# Patient Record
Sex: Female | Born: 1935 | Race: Black or African American | Hispanic: No | Smoking: Never smoker
Health system: Southern US, Community
[De-identification: ages and names within clinical notes are randomized; demographics above are authoritative.]

## PROBLEM LIST (undated history)

## (undated) DIAGNOSIS — E785 Hyperlipidemia, unspecified: Secondary | ICD-10-CM

## (undated) DIAGNOSIS — H9190 Unspecified hearing loss, unspecified ear: Secondary | ICD-10-CM

## (undated) DIAGNOSIS — E0590 Thyrotoxicosis, unspecified without thyrotoxic crisis or storm: Secondary | ICD-10-CM

## (undated) DIAGNOSIS — C50919 Malignant neoplasm of unspecified site of unspecified female breast: Secondary | ICD-10-CM

## (undated) DIAGNOSIS — I10 Essential (primary) hypertension: Secondary | ICD-10-CM

## (undated) HISTORY — DX: Essential (primary) hypertension: I10

## (undated) HISTORY — DX: Hyperlipidemia, unspecified: E78.5

## (undated) HISTORY — DX: Unspecified hearing loss, unspecified ear: H91.90

## (undated) HISTORY — DX: Thyrotoxicosis, unspecified without thyrotoxic crisis or storm: E05.90

---

## 2004-01-09 DIAGNOSIS — C50919 Malignant neoplasm of unspecified site of unspecified female breast: Secondary | ICD-10-CM

## 2004-01-09 HISTORY — DX: Malignant neoplasm of unspecified site of unspecified female breast: C50.919

## 2004-12-20 ENCOUNTER — Ambulatory Visit: Payer: Self-pay | Admitting: Internal Medicine

## 2005-01-08 HISTORY — PX: BREAST EXCISIONAL BIOPSY: SUR124

## 2005-01-23 ENCOUNTER — Ambulatory Visit: Payer: Self-pay | Admitting: Surgery

## 2005-01-23 ENCOUNTER — Other Ambulatory Visit: Payer: Self-pay

## 2005-02-01 ENCOUNTER — Ambulatory Visit: Payer: Self-pay | Admitting: Surgery

## 2005-03-02 ENCOUNTER — Ambulatory Visit: Payer: Self-pay | Admitting: Internal Medicine

## 2005-06-05 ENCOUNTER — Ambulatory Visit: Payer: Self-pay | Admitting: Internal Medicine

## 2005-06-08 ENCOUNTER — Ambulatory Visit: Payer: Self-pay | Admitting: Internal Medicine

## 2005-09-04 ENCOUNTER — Ambulatory Visit: Payer: Self-pay | Admitting: Internal Medicine

## 2005-09-08 ENCOUNTER — Ambulatory Visit: Payer: Self-pay | Admitting: Internal Medicine

## 2006-01-10 ENCOUNTER — Ambulatory Visit: Payer: Self-pay | Admitting: Internal Medicine

## 2006-02-08 ENCOUNTER — Ambulatory Visit: Payer: Self-pay | Admitting: Internal Medicine

## 2006-05-09 ENCOUNTER — Ambulatory Visit: Payer: Self-pay | Admitting: Internal Medicine

## 2006-05-13 ENCOUNTER — Ambulatory Visit: Payer: Self-pay | Admitting: Internal Medicine

## 2006-06-09 ENCOUNTER — Ambulatory Visit: Payer: Self-pay | Admitting: Internal Medicine

## 2006-09-09 ENCOUNTER — Ambulatory Visit: Payer: Self-pay | Admitting: Internal Medicine

## 2006-09-23 ENCOUNTER — Ambulatory Visit: Payer: Self-pay | Admitting: Internal Medicine

## 2006-10-09 ENCOUNTER — Ambulatory Visit: Payer: Self-pay | Admitting: Internal Medicine

## 2007-02-09 ENCOUNTER — Ambulatory Visit: Payer: Self-pay | Admitting: Internal Medicine

## 2007-02-17 ENCOUNTER — Ambulatory Visit: Payer: Self-pay | Admitting: Internal Medicine

## 2007-03-09 ENCOUNTER — Ambulatory Visit: Payer: Self-pay | Admitting: Internal Medicine

## 2007-04-09 ENCOUNTER — Ambulatory Visit: Payer: Self-pay | Admitting: Internal Medicine

## 2007-08-09 ENCOUNTER — Ambulatory Visit: Payer: Self-pay | Admitting: Internal Medicine

## 2007-08-19 ENCOUNTER — Ambulatory Visit: Payer: Self-pay | Admitting: Internal Medicine

## 2007-09-09 ENCOUNTER — Ambulatory Visit: Payer: Self-pay | Admitting: Internal Medicine

## 2007-11-09 ENCOUNTER — Ambulatory Visit: Payer: Self-pay | Admitting: Internal Medicine

## 2009-09-06 ENCOUNTER — Ambulatory Visit: Payer: Self-pay | Admitting: Oncology

## 2009-09-08 ENCOUNTER — Ambulatory Visit: Payer: Self-pay | Admitting: Internal Medicine

## 2009-09-09 ENCOUNTER — Ambulatory Visit: Payer: Self-pay | Admitting: Internal Medicine

## 2009-10-08 ENCOUNTER — Ambulatory Visit: Payer: Self-pay | Admitting: Internal Medicine

## 2010-08-14 ENCOUNTER — Emergency Department: Payer: Self-pay | Admitting: Emergency Medicine

## 2010-10-02 ENCOUNTER — Ambulatory Visit: Payer: Self-pay | Admitting: Internal Medicine

## 2010-10-09 ENCOUNTER — Ambulatory Visit: Payer: Self-pay | Admitting: Internal Medicine

## 2010-11-08 ENCOUNTER — Ambulatory Visit: Payer: Self-pay | Admitting: Internal Medicine

## 2010-11-09 ENCOUNTER — Ambulatory Visit: Payer: Self-pay | Admitting: Internal Medicine

## 2011-12-17 IMAGING — US ULTRASOUND AORTA
1 series · 17 of 21 positions shown · non-contrast
Comparison: none

REASON FOR EXAM: abd bruit
COMMENTS:

[Series 1: ultrasound aorta · 17 of 21 slices shown]
[im 1/21]
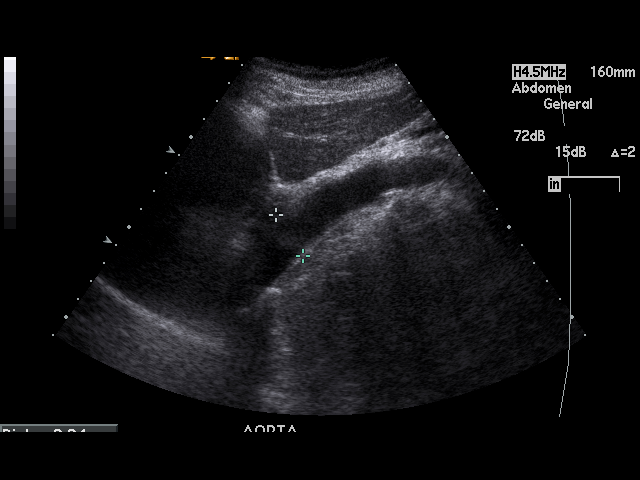
[im 2/21]
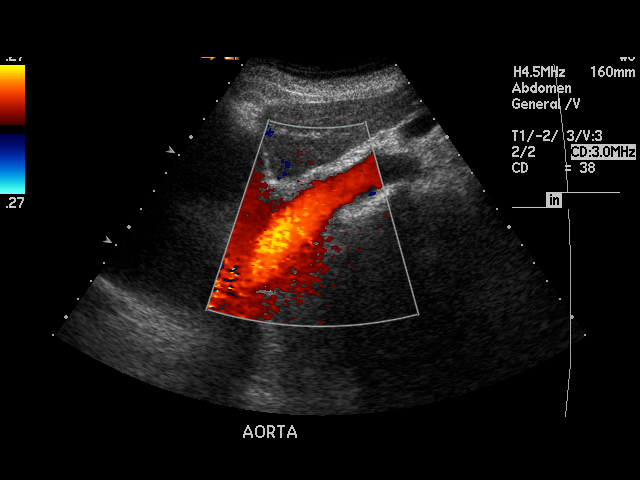
[im 4/21]
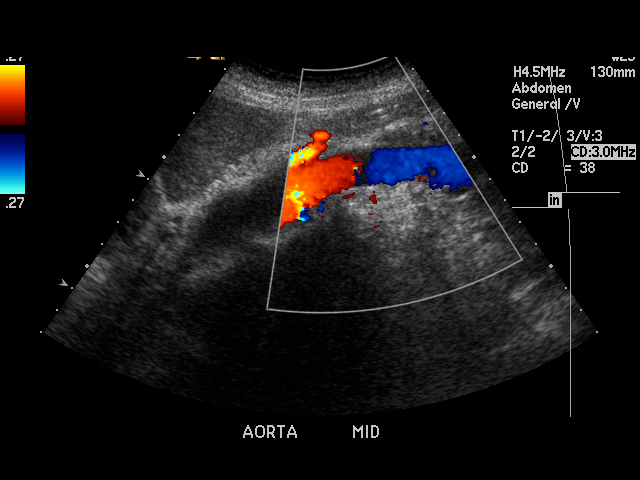
[im 5/21]
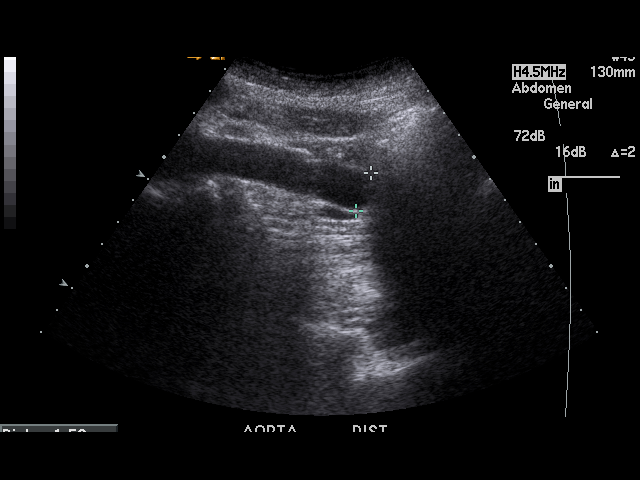
[im 6/21]
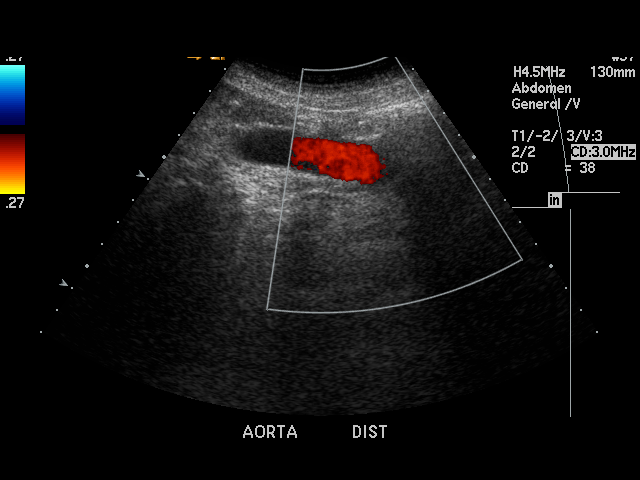
[im 7/21]
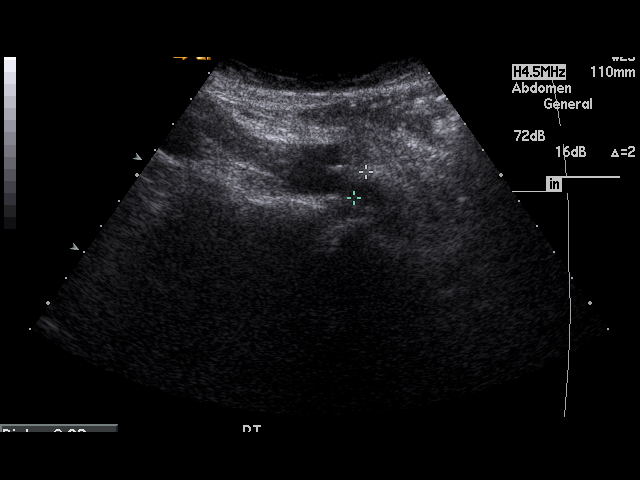
[im 9/21]
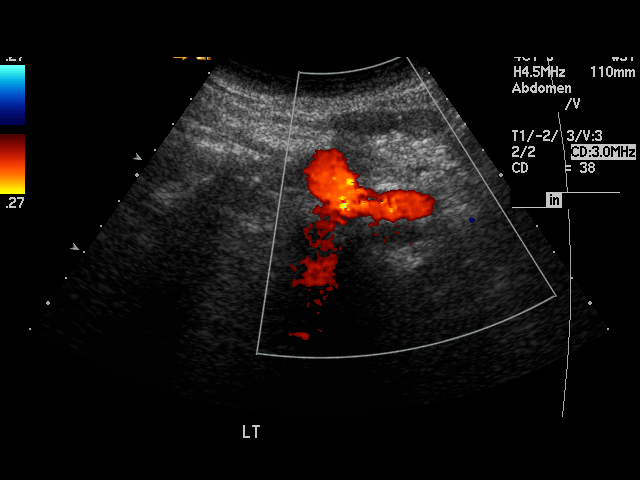
[im 10/21]
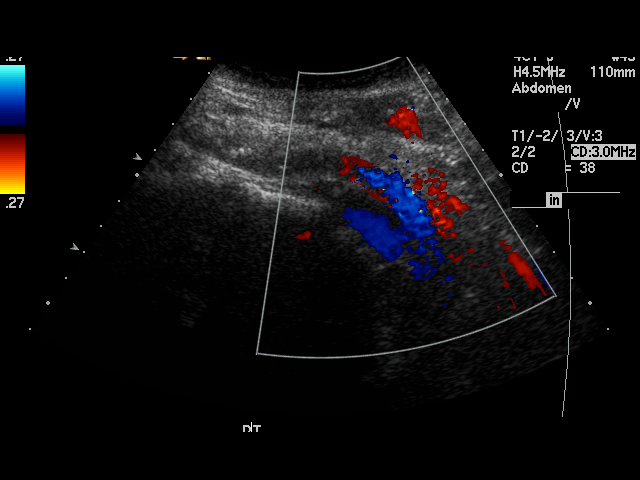
[im 11/21]
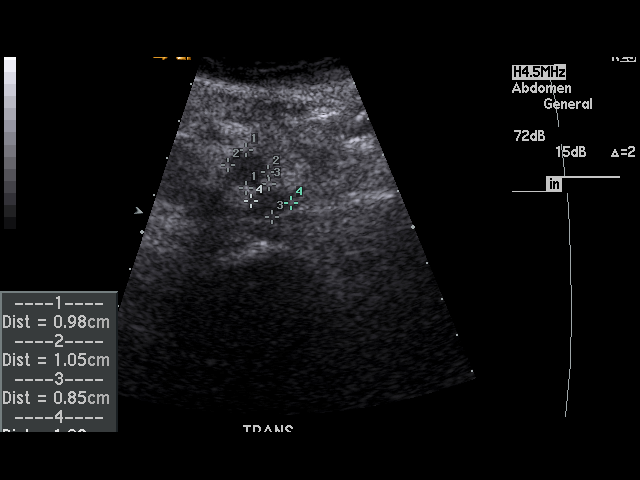
[im 12/21]
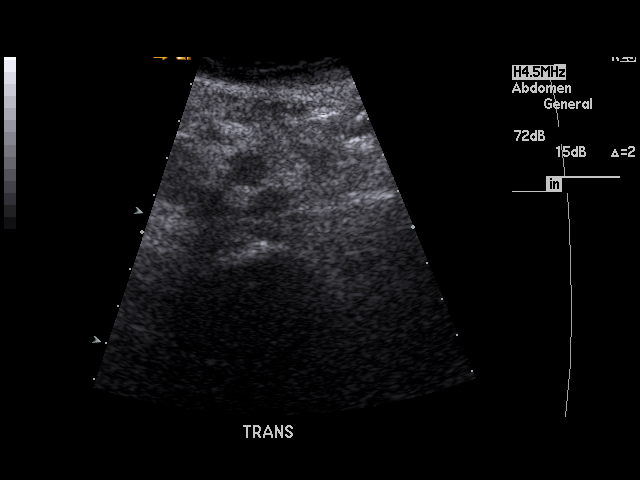
[im 13/21]
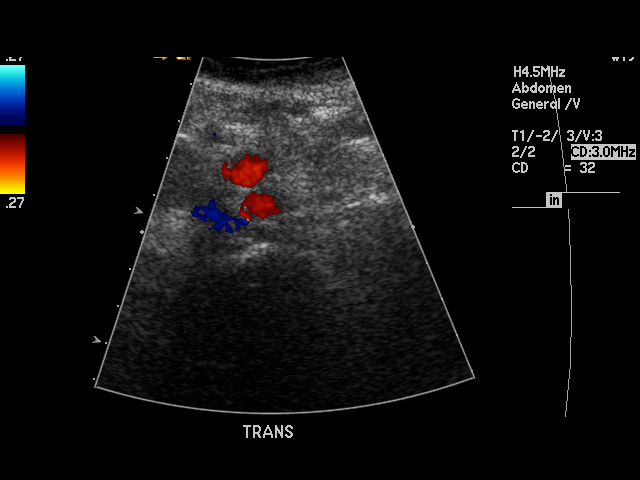
[im 15/21]
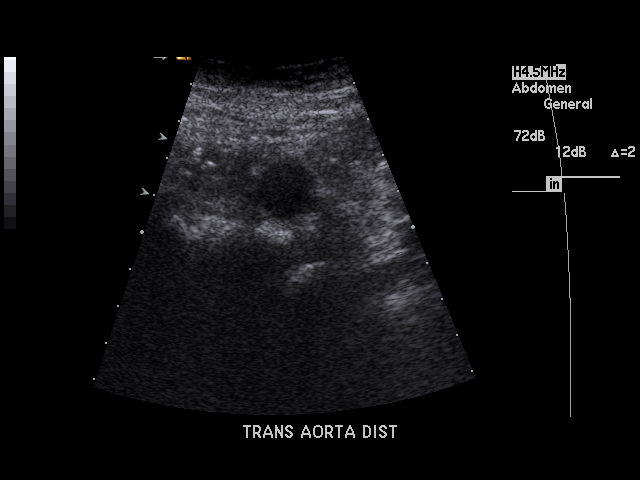
[im 16/21]
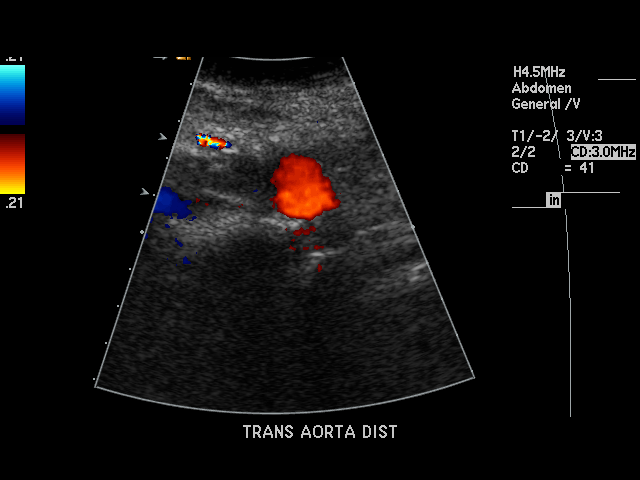
[im 17/21]
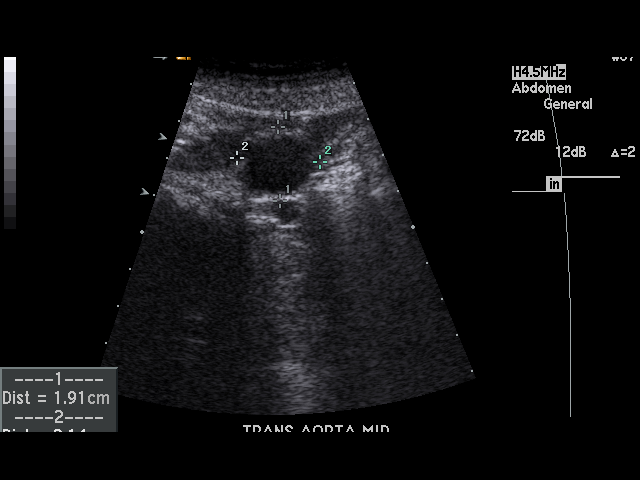
[im 18/21]
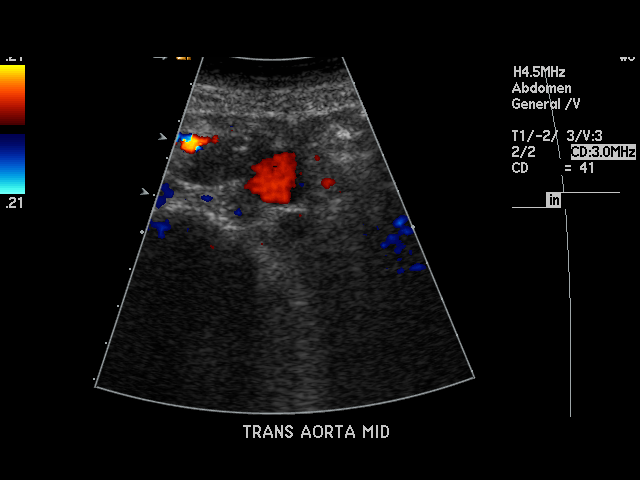
[im 20/21]
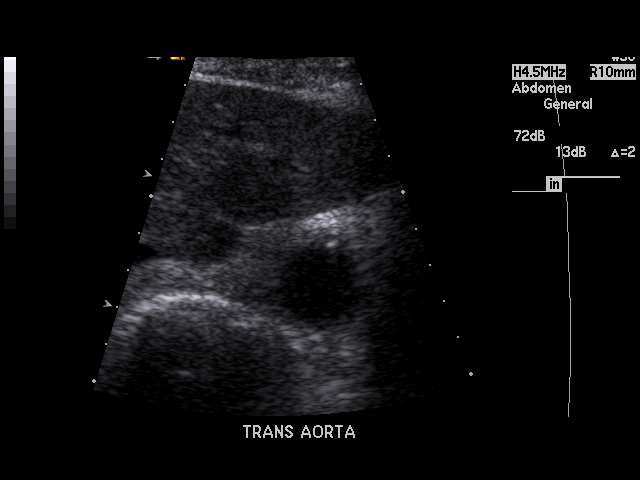
[im 21/21]
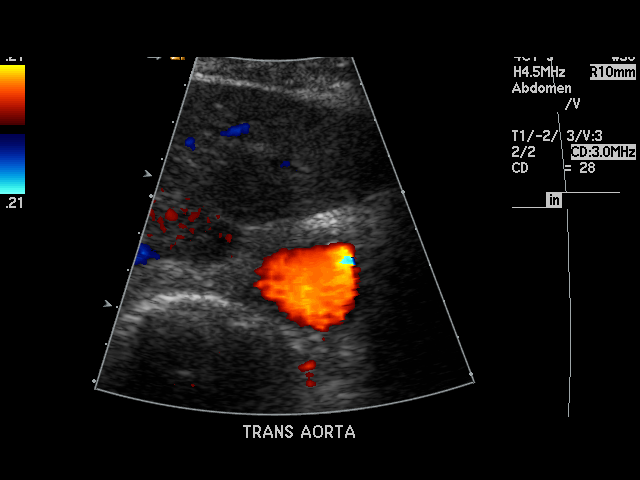

[17 of 21 positions shown; findings below may reference images not displayed]

PROCEDURE:     ABDUL-SAMED - ABDUL-SAMED AORTA  - November 08, 2010  [DATE]

RESULT:     The abdominal aorta is visualized and is normal in appearance.
No aneurysm formation or stenosis is identified. The aorta measures 2.48 cm
at maximum diameter as measured in its proximal portion. The common iliac
arteries are normal in appearance.
IMPRESSION: 1.     No significant abnormalities are noted.

## 2012-01-25 ENCOUNTER — Other Ambulatory Visit: Payer: Self-pay | Admitting: Internal Medicine

## 2012-01-25 MED ORDER — RAMIPRIL 2.5 MG PO CAPS: 2.5000 mg | ORAL_CAPSULE | Freq: Every day | ORAL | Status: DC

## 2012-01-25 MED ORDER — POTASSIUM CHLORIDE ER 10 MEQ PO CPCR: 10.0000 meq | ORAL_CAPSULE | Freq: Two times a day (BID) | ORAL | Status: DC

## 2012-01-25 MED ORDER — TRIAMTERENE-HCTZ 37.5-25 MG PO TABS: 1.0000 | ORAL_TABLET | Freq: Every day | ORAL | Status: DC

## 2012-01-25 NOTE — Telephone Encounter (Signed)
Potassium Chloride 10 meq   Take two tablets by mouth every morning  #60  Ramipril 2.5 mg   Take 1 capsule by mouth once daily  # 30  Triamterene HCTZ 37.5-25 mg   Take 1 tablet by mouth every day  # 30

## 2012-01-25 NOTE — Telephone Encounter (Signed)
Sent in to pharmacy.  

## 2012-03-24 ENCOUNTER — Encounter: Payer: Self-pay | Admitting: *Deleted

## 2012-03-27 ENCOUNTER — Encounter: Payer: Self-pay | Admitting: Internal Medicine

## 2012-03-27 ENCOUNTER — Ambulatory Visit (INDEPENDENT_AMBULATORY_CARE_PROVIDER_SITE_OTHER): Payer: 59 | Admitting: Internal Medicine

## 2012-03-27 VITALS — BP 160/90 | HR 91 | Temp 98.5°F | Ht 59.0 in | Wt 114.0 lb

## 2012-03-27 DIAGNOSIS — N6310 Unspecified lump in the right breast, unspecified quadrant: Secondary | ICD-10-CM | POA: Insufficient documentation

## 2012-03-27 DIAGNOSIS — D0592 Unspecified type of carcinoma in situ of left breast: Secondary | ICD-10-CM

## 2012-03-27 DIAGNOSIS — D0590 Unspecified type of carcinoma in situ of unspecified breast: Secondary | ICD-10-CM

## 2012-03-27 DIAGNOSIS — I10 Essential (primary) hypertension: Secondary | ICD-10-CM | POA: Insufficient documentation

## 2012-03-27 DIAGNOSIS — E0590 Thyrotoxicosis, unspecified without thyrotoxic crisis or storm: Secondary | ICD-10-CM

## 2012-03-27 LAB — CBC WITH DIFFERENTIAL/PLATELET
Eosinophils Relative: 3 % (ref 0.0–5.0)
HCT: 39.3 % (ref 36.0–46.0)
Hemoglobin: 13 g/dL (ref 12.0–15.0)
Lymphs Abs: 1 10*3/uL (ref 0.7–4.0)
Monocytes Relative: 14.6 % — ABNORMAL HIGH (ref 3.0–12.0)
Neutro Abs: 1.3 10*3/uL — ABNORMAL LOW (ref 1.4–7.7)
WBC: 2.8 10*3/uL — ABNORMAL LOW (ref 4.5–10.5)

## 2012-03-27 LAB — COMPREHENSIVE METABOLIC PANEL
AST: 19 U/L (ref 0–37)
Alkaline Phosphatase: 49 U/L (ref 39–117)
BUN: 25 mg/dL — ABNORMAL HIGH (ref 6–23)
Creatinine, Ser: 1.1 mg/dL (ref 0.4–1.2)
Potassium: 4 mEq/L (ref 3.5–5.1)

## 2012-03-27 LAB — LIPID PANEL
Cholesterol: 148 mg/dL (ref 0–200)
HDL: 58.3 mg/dL (ref >39.00)
LDL Cholesterol: 78 mg/dL (ref 0–99)
Triglycerides: 59 mg/dL (ref 0.0–149.0)
VLDL: 11.8 mg/dL (ref 0.0–40.0)

## 2012-03-27 MED ORDER — TRIAMTERENE-HCTZ 37.5-25 MG PO TABS: 1.0000 | ORAL_TABLET | Freq: Every day | ORAL | Status: DC

## 2012-03-27 MED ORDER — RAMIPRIL 2.5 MG PO CAPS: 2.5000 mg | ORAL_CAPSULE | Freq: Every day | ORAL | Status: DC

## 2012-03-27 MED ORDER — POTASSIUM CHLORIDE ER 10 MEQ PO CPCR: 10.0000 meq | ORAL_CAPSULE | Freq: Two times a day (BID) | ORAL | Status: DC

## 2012-03-27 NOTE — Assessment & Plan Note (Signed)
S/p ablation.  Check tsh.    

## 2012-03-27 NOTE — Progress Notes (Signed)
  Subjective:    Patient ID: Ellen Wright, female    DOB: 04/13/1918, 79 y.o.   MRN: [id number]  HPI 79 year old female with past history of hypertension, hearing loss and hyperthyroidism s/p ablation who presents to clinic to follow up on these issues as well as for a complete physical exam.  She is accompanied by her daughter.  History obtained from both of them.  Her daughter states she has been doing well.  Breathing stable.  Eating well.  No nausea or vomiting.  Still problems with constipation.  Discussed miralax.     Past Medical History  Diagnosis Date  . Hypertension   . Hyperlipidemia   . Hyperthyroidism     s/p ablation  . Hearing loss     Current Outpatient Prescriptions on File Prior to Visit  Medication Sig Dispense Refill  . dorzolamide-timolol (COSOPT) 22.3-6.8 MG/ML ophthalmic solution 1 drop 2 (two) times daily.      . Travoprost, BAK Free, (TRAVATAN) 0.004 % SOLN ophthalmic solution 1 drop at bedtime.      . Fish Oil-Cholecalciferol (OMEGA-3 FISH OIL/VITAMIN D3) 1000-1000 MG-UNIT CAPS Take by mouth.      . letrozole (FEMARA) 2.5 MG tablet Take 2.5 mg by mouth daily.       No current facility-administered medications on file prior to visit.    Review of Systems Patient denies any headache, lightheadedness or dizziness. No sinus or allergy symptoms.  Persistent hearing loss.  No chest pain, tightness or palpitations.  No increased shortness of breath, cough or congestion.  No nausea or vomiting.  No acid reflux.  No abdominal pain or cramping.  No bowel change, such as diarrhea, BRBPR or melana.  Does have persistent problems with constipation.  No urine change.   States eating and drinking well.  Off Femara.      Objective:   Physical Exam Filed Vitals:   03/27/12 0806  BP: 160/90  Pulse: 91  Temp: 98.5 F (25.69 C)   79 year old female in no acute distress.   HEENT:  Nares- clear.  Oropharynx - without lesions. NECK:  Supple.  Nontender.  No audible bruit.   HEART:  Appears to be regular. LUNGS:  No crackles or wheezing audible.  Respirations even and unlabored.  RADIAL PULSE:  Equal bilaterally.    BREASTS:  No nipple discharge or nipple retraction present.  Could not appreciate any distinct nodules or axillary adenopathy.  ABDOMEN:  Soft, nontender.  Bowel sounds present and normal.  No audible abdominal bruit.  GU:  Pt declined.   RECTAL:  Pt declined.   EXTREMITIES:  No increased edema present.  DP pulses palpable and equal bilaterally.          Assessment & Plan:  CARDIOVASCULAR.  Symptoms stable.  No acute issues.  Has declined further w/up.  PREVIOUS WEIGHT LOSS.  Weight stable.  Follow.   HISTORY OF ABDOMINAL BRUIT.  Aortic ultrasound revealed no significant abnormalities.   HEALTH MAINTENANCE.  Physical today.  Schedule mammogram.  Declined GU and rectal exam. Check cholesterol.  Declines GI evaluation.

## 2012-03-27 NOTE — Assessment & Plan Note (Signed)
Blood pressure a little elevated today.  Recheck 154/78.  Same medication regimen.  Check metabolic panel.

## 2012-03-27 NOTE — Assessment & Plan Note (Signed)
Off Femara.  States has been released by the [employer].  Schedule mammogram.

## 2012-04-05 ENCOUNTER — Other Ambulatory Visit: Payer: Self-pay | Admitting: Internal Medicine

## 2012-04-05 DIAGNOSIS — N289 Disorder of kidney and ureter, unspecified: Secondary | ICD-10-CM

## 2012-04-05 DIAGNOSIS — D72819 Decreased white blood cell count, unspecified: Secondary | ICD-10-CM

## 2012-04-05 NOTE — Progress Notes (Signed)
Order placed for follow up labs 

## 2012-05-08 ENCOUNTER — Ambulatory Visit: Payer: 59 | Admitting: Internal Medicine

## 2012-10-09 ENCOUNTER — Other Ambulatory Visit: Payer: Self-pay | Admitting: *Deleted

## 2012-10-09 NOTE — Telephone Encounter (Signed)
i am ok to refill these meds, but she needs a follow up appt.  (needs to be ).  Ok to refill until appt.  Thanks.

## 2012-10-09 NOTE — Telephone Encounter (Signed)
Only seen once in March (New patient)-Okay to refill?

## 2012-10-09 NOTE — Telephone Encounter (Signed)
duplicate

## 2012-10-10 MED ORDER — RAMIPRIL 2.5 MG PO CAPS: 2.5000 mg | ORAL_CAPSULE | Freq: Every day | ORAL | Status: DC

## 2012-10-10 MED ORDER — POTASSIUM CHLORIDE ER 10 MEQ PO CPCR: 10.0000 meq | ORAL_CAPSULE | Freq: Two times a day (BID) | ORAL | Status: DC

## 2012-10-10 MED ORDER — TRIAMTERENE-HCTZ 37.5-25 MG PO TABS: 1.0000 | ORAL_TABLET | Freq: Every day | ORAL | Status: DC

## 2012-10-10 NOTE — Telephone Encounter (Signed)
Refilled meds #30-pt scheduled for a 30 min appt on 10/9 @ 10:30

## 2012-10-16 ENCOUNTER — Encounter (INDEPENDENT_AMBULATORY_CARE_PROVIDER_SITE_OTHER): Payer: Self-pay

## 2012-10-16 ENCOUNTER — Ambulatory Visit (INDEPENDENT_AMBULATORY_CARE_PROVIDER_SITE_OTHER): Payer: PRIVATE HEALTH INSURANCE | Admitting: Internal Medicine

## 2012-10-16 ENCOUNTER — Encounter: Payer: Self-pay | Admitting: Internal Medicine

## 2012-10-16 VITALS — BP 130/80 | HR 85 | Temp 98.3°F | Ht 59.0 in | Wt 117.0 lb

## 2012-10-16 DIAGNOSIS — C50919 Malignant neoplasm of unspecified site of unspecified female breast: Secondary | ICD-10-CM

## 2012-10-16 DIAGNOSIS — I10 Essential (primary) hypertension: Secondary | ICD-10-CM

## 2012-10-16 DIAGNOSIS — Z23 Encounter for immunization: Secondary | ICD-10-CM

## 2012-10-16 DIAGNOSIS — E0590 Thyrotoxicosis, unspecified without thyrotoxic crisis or storm: Secondary | ICD-10-CM

## 2012-10-16 LAB — COMPREHENSIVE METABOLIC PANEL
ALT: 9 U/L (ref 0–35)
AST: 19 U/L (ref 0–37)
Albumin: 3.9 g/dL (ref 3.5–5.2)
BUN: 20 mg/dL (ref 6–23)
CO2: 31 mEq/L (ref 19–32)
Calcium: 9.8 mg/dL (ref 8.4–10.5)
Chloride: 102 mEq/L (ref 96–112)
Creatinine, Ser: 1.2 mg/dL (ref 0.4–1.2)
GFR: 53.03 mL/min — ABNORMAL LOW (ref >60.00)
Potassium: 3.9 mEq/L (ref 3.5–5.1)

## 2012-10-16 MED ORDER — TRIAMTERENE-HCTZ 37.5-25 MG PO TABS: 1.0000 | ORAL_TABLET | Freq: Every day | ORAL | Status: DC

## 2012-10-16 MED ORDER — RAMIPRIL 2.5 MG PO CAPS: 2.5000 mg | ORAL_CAPSULE | Freq: Every day | ORAL | Status: DC

## 2012-10-16 MED ORDER — POTASSIUM CHLORIDE ER 10 MEQ PO CPCR: 10.0000 meq | ORAL_CAPSULE | Freq: Two times a day (BID) | ORAL | Status: DC

## 2012-10-19 ENCOUNTER — Encounter: Payer: Self-pay | Admitting: Internal Medicine

## 2012-10-19 NOTE — Assessment & Plan Note (Signed)
Off Femara.  States has been released by the [employer]. Needs her mammogram.

## 2012-10-19 NOTE — Progress Notes (Signed)
  Subjective:    Patient ID: Ellen Wright, female    DOB: 03-11-1918, 79 y.o.   MRN: [id number]  HPI 79 year old female with past history of hypertension, hearing loss and hyperthyroidism s/p ablation who presents to clinic for a scheduled follow up.  She is accompanied by her daughter.  History obtained from both of them.  Her daughter states she has been doing well.  Breathing stable.  Eating well.  No nausea or vomiting.  Still problems with some constipation.  Not severe.  Discussed miralax. Overall doing well.      Past Medical History  Diagnosis Date  . Hypertension   . Hyperlipidemia   . Hyperthyroidism     s/p ablation  . Hearing loss     Current Outpatient Prescriptions on File Prior to Visit  Medication Sig Dispense Refill  . Fish Oil-Cholecalciferol (OMEGA-3 FISH OIL/VITAMIN D3) 1000-1000 MG-UNIT CAPS Take by mouth.      . letrozole (FEMARA) 2.5 MG tablet Take 2.5 mg by mouth daily.      . Travoprost, BAK Free, (TRAVATAN) 0.004 % SOLN ophthalmic solution 1 drop at bedtime.       No current facility-administered medications on file prior to visit.    Review of Systems Patient denies any headache, lightheadedness or dizziness. No sinus or allergy symptoms.  Persistent hearing loss.  No chest pain, tightness or palpitations.  No increased shortness of breath, cough or congestion.  No nausea or vomiting.  No acid reflux.  No abdominal pain or cramping.  No bowel change, such as diarrhea, BRBPR or melana.  Does have persistent problems with constipation.  No urine change.   States eating and drinking well.  Off Femara.      Objective:   Physical Exam  Filed Vitals:   10/16/12 1034  BP: 130/80  Pulse: 85  Temp: 98.3 F (2.100 C)   79 year old female in no acute distress.   HEENT:  Nares- clear.  Oropharynx - without lesions. NECK:  Supple.  Nontender.  No audible bruit.  HEART:  Appears to be regular. LUNGS:  No crackles or wheezing audible.  Respirations even and  unlabored.  RADIAL PULSE:  Equal bilaterally.  ABDOMEN:  Soft, nontender.  Bowel sounds present and normal.  No audible abdominal bruit.   EXTREMITIES:  No increased edema present.  DP pulses palpable and equal bilaterally.          Assessment & Plan:  CARDIOVASCULAR.  Symptoms stable.  No acute issues.  Has declined further w/up.  PREVIOUS WEIGHT LOSS.  Weight stable.  Up a few pounds.  Follow.   HISTORY OF ABDOMINAL BRUIT.  Aortic ultrasound revealed no significant abnormalities.   HEALTH MAINTENANCE.  Physical last visit.  Scheduled mammogram previous visit.  Declined GU and rectal exam. Check cholesterol.  Declines GI evaluation.

## 2012-10-19 NOTE — Assessment & Plan Note (Signed)
Blood pressure under reasonable control.  Same medication regimen.  Check metabolic panel.

## 2012-10-19 NOTE — Assessment & Plan Note (Signed)
S/p ablation.  Check tsh.    

## 2013-03-30 ENCOUNTER — Encounter: Payer: 59 | Admitting: Internal Medicine

## 2013-05-18 ENCOUNTER — Other Ambulatory Visit: Payer: Self-pay | Admitting: *Deleted

## 2013-05-18 MED ORDER — POTASSIUM CHLORIDE ER 10 MEQ PO CPCR: 10.0000 meq | ORAL_CAPSULE | Freq: Two times a day (BID) | ORAL | Status: DC

## 2013-05-21 ENCOUNTER — Telehealth: Payer: Self-pay | Admitting: Internal Medicine

## 2013-05-21 MED ORDER — TRIAMTERENE-HCTZ 37.5-25 MG PO TABS: 1.0000 | ORAL_TABLET | Freq: Every day | ORAL | Status: DC

## 2013-05-21 NOTE — Telephone Encounter (Signed)
triamterene-hydrochlorothiazide (MAXZIDE-25) 37.5-25 MG per

## 2013-06-17 ENCOUNTER — Other Ambulatory Visit: Payer: Self-pay | Admitting: *Deleted

## 2013-06-17 MED ORDER — RAMIPRIL 2.5 MG PO CAPS: 2.5000 mg | ORAL_CAPSULE | Freq: Every day | ORAL | Status: DC

## 2013-06-17 NOTE — Telephone Encounter (Signed)
Appt 8/28

## 2013-07-31 ENCOUNTER — Other Ambulatory Visit: Payer: Self-pay | Admitting: *Deleted

## 2013-07-31 MED ORDER — TRIAMTERENE-HCTZ 37.5-25 MG PO TABS: 1.0000 | ORAL_TABLET | Freq: Every day | ORAL | Status: DC

## 2013-07-31 MED ORDER — RAMIPRIL 2.5 MG PO CAPS: 2.5000 mg | ORAL_CAPSULE | Freq: Every day | ORAL | Status: DC

## 2013-07-31 MED ORDER — POTASSIUM CHLORIDE ER 10 MEQ PO CPCR: 10.0000 meq | ORAL_CAPSULE | Freq: Two times a day (BID) | ORAL | Status: DC

## 2013-08-04 ENCOUNTER — Encounter (INDEPENDENT_AMBULATORY_CARE_PROVIDER_SITE_OTHER): Payer: Self-pay

## 2013-08-04 ENCOUNTER — Encounter: Payer: Self-pay | Admitting: Internal Medicine

## 2013-08-04 ENCOUNTER — Ambulatory Visit (INDEPENDENT_AMBULATORY_CARE_PROVIDER_SITE_OTHER): Payer: PRIVATE HEALTH INSURANCE | Admitting: Internal Medicine

## 2013-08-04 VITALS — BP 126/80 | HR 95 | Temp 98.0°F | Ht 59.0 in | Wt 118.2 lb

## 2013-08-04 DIAGNOSIS — E0590 Thyrotoxicosis, unspecified without thyrotoxic crisis or storm: Secondary | ICD-10-CM

## 2013-08-04 DIAGNOSIS — C50919 Malignant neoplasm of unspecified site of unspecified female breast: Secondary | ICD-10-CM

## 2013-08-04 DIAGNOSIS — I10 Essential (primary) hypertension: Secondary | ICD-10-CM

## 2013-08-04 NOTE — Progress Notes (Signed)
Pre visit review using our clinic review tool, if applicable. No additional management support is needed unless otherwise documented below in the visit note. 

## 2013-08-05 LAB — CBC WITH DIFFERENTIAL/PLATELET
BASOS PCT: 0.6 % (ref 0.0–3.0)
Basophils Absolute: 0 10*3/uL (ref 0.0–0.1)
EOS PCT: 1.8 % (ref 0.0–5.0)
Eosinophils Absolute: 0.1 10*3/uL (ref 0.0–0.7)
HEMATOCRIT: 39.3 % (ref 36.0–46.0)
Hemoglobin: 13.1 g/dL (ref 12.0–15.0)
LYMPHS ABS: 0.9 10*3/uL (ref 0.7–4.0)
Lymphocytes Relative: 28.6 % (ref 12.0–46.0)
MCHC: 33.2 g/dL (ref 30.0–36.0)
MCV: 89.1 fl (ref 78.0–100.0)
MONO ABS: 0.5 10*3/uL (ref 0.1–1.0)
Monocytes Relative: 14 % — ABNORMAL HIGH (ref 3.0–12.0)
NEUTROS ABS: 1.8 10*3/uL (ref 1.4–7.7)
Neutrophils Relative %: 55 % (ref 43.0–77.0)
Platelets: 196 10*3/uL (ref 150.0–400.0)
RBC: 4.41 Mil/uL (ref 3.87–5.11)
RDW: 14.1 % (ref 11.5–15.5)
WBC: 3.3 10*3/uL — AB (ref 4.0–10.5)

## 2013-08-05 LAB — COMPREHENSIVE METABOLIC PANEL
ALBUMIN: 3.7 g/dL (ref 3.5–5.2)
ALT: 10 U/L (ref 0–35)
AST: 19 U/L (ref 0–37)
Alkaline Phosphatase: 56 U/L (ref 39–117)
BUN: 18 mg/dL (ref 6–23)
CALCIUM: 9.5 mg/dL (ref 8.4–10.5)
CHLORIDE: 106 meq/L (ref 96–112)
CO2: 31 mEq/L (ref 19–32)
Creatinine, Ser: 1 mg/dL (ref 0.4–1.2)
GFR: 68.97 mL/min (ref >60.00)
GLUCOSE: 86 mg/dL (ref 70–99)
Potassium: 4 mEq/L (ref 3.5–5.1)
Sodium: 142 mEq/L (ref 135–145)
Total Bilirubin: 0.6 mg/dL (ref 0.2–1.2)
Total Protein: 8.2 g/dL (ref 6.0–8.3)

## 2013-08-05 LAB — TSH: TSH: 0.97 u[IU]/mL (ref 0.35–4.50)

## 2013-08-06 ENCOUNTER — Encounter: Payer: Self-pay | Admitting: *Deleted

## 2013-08-07 ENCOUNTER — Encounter: Payer: 59 | Admitting: Internal Medicine

## 2013-08-09 ENCOUNTER — Encounter: Payer: Self-pay | Admitting: Internal Medicine

## 2013-08-09 NOTE — Assessment & Plan Note (Signed)
Blood pressure has been under reasonable control.  Elevated today.  She has been out of her medication for one week.  Just started back on her medication today.  Hold on making any changes.  Get her back in soon to reassess. Check metabolic panel.

## 2013-08-09 NOTE — Assessment & Plan Note (Signed)
S/p ablation.  Check tsh.

## 2013-08-09 NOTE — Progress Notes (Signed)
  Subjective:    Patient ID: Ellen Wright, female    DOB: 07-16-1918, 80 y.o.   MRN: [id number]  HPI 80 year old female with past history of hypertension, hearing loss and hyperthyroidism s/p ablation who presents to clinic for a scheduled follow up.  She is accompanied by her daughter.  History obtained from both of them.  Her daughter states she has been doing well.  Breathing stable.  Eating well.  No nausea or vomiting.  Still problems with some constipation.  Not severe.  Discussed miralax again.  Still not using regularly.  Overall doing well.  Weight stable.      Past Medical History  Diagnosis Date  . Hypertension   . Hyperlipidemia   . Hyperthyroidism     s/p ablation  . Hearing loss     Current Outpatient Prescriptions on File Prior to Visit  Medication Sig Dispense Refill  . Fish Oil-Cholecalciferol (OMEGA-3 FISH OIL/VITAMIN D3) 1000-1000 MG-UNIT CAPS Take by mouth.      . potassium chloride (MICRO-K) 10 MEQ CR capsule Take 1 capsule (10 mEq total) by mouth 2 (two) times daily.  180 capsule  1  . ramipril (ALTACE) 2.5 MG capsule Take 1 capsule (2.5 mg total) by mouth daily.  90 capsule  1  . Travoprost, BAK Free, (TRAVATAN) 0.004 % SOLN ophthalmic solution 1 drop at bedtime.      . triamterene-hydrochlorothiazide (MAXZIDE-25) 37.5-25 MG per tablet Take 1 tablet by mouth daily.  90 tablet  1   No current facility-administered medications on file prior to visit.    Review of Systems Patient denies any headache, lightheadedness or dizziness. No sinus or allergy symptoms.  Persistent hearing loss.  No chest pain, tightness or palpitations.  No increased shortness of breath, cough or congestion.  No nausea or vomiting.  No acid reflux.  No abdominal pain or cramping.  No bowel change, such as diarrhea, BRBPR or melana.  Does have persistent problems with constipation.  No urine change.   States eating and drinking well.  Off Femara.      Objective:   Physical Exam  Filed  Vitals:   08/04/13 1428  BP: 126/80  Pulse: 95  Temp: 98 F (78.80 C)   80 year old female in no acute distress.   HEENT:  Nares- clear.  Oropharynx - without lesions. NECK:  Supple.  Nontender.  No audible bruit.  HEART:  Appears to be regular. LUNGS:  No crackles or wheezing audible.  Respirations even and unlabored.  RADIAL PULSE:  Equal bilaterally.  ABDOMEN:  Soft, nontender.  Bowel sounds present and normal.  No audible abdominal bruit.   EXTREMITIES:  No increased edema present.  DP pulses palpable and equal bilaterally.          Assessment & Plan:  CARDIOVASCULAR.  Symptoms stable.  No acute issues.  Has declined further w/up.  PREVIOUS WEIGHT LOSS.  Weight stable from last check.  Follow.    HISTORY OF ABDOMINAL BRUIT.  Aortic ultrasound revealed no significant abnormalities.   HEALTH MAINTENANCE.  Physical overdue.  Will need to schedule.   Scheduled mammogram previous visit.  Apparently missed appt.  Need to reschedule.  Declined GU and rectal exam. Check cholesterol.  Declines GI evaluation.   I spent 25 minutes with the patient and her daughter and more than 50% of the time was spent in consultation regarding the above.

## 2013-08-09 NOTE — Assessment & Plan Note (Addendum)
Off Femara.  States has been released by the [employer]. Needs her mammogram.  Will reschedule.

## 2013-09-04 ENCOUNTER — Ambulatory Visit (INDEPENDENT_AMBULATORY_CARE_PROVIDER_SITE_OTHER): Payer: PRIVATE HEALTH INSURANCE | Admitting: Internal Medicine

## 2013-09-04 ENCOUNTER — Encounter: Payer: Self-pay | Admitting: Internal Medicine

## 2013-09-04 VITALS — BP 120/70 | HR 89 | Temp 98.0°F | Ht <= 58 in | Wt 115.0 lb

## 2013-09-04 DIAGNOSIS — Z23 Encounter for immunization: Secondary | ICD-10-CM

## 2013-09-04 DIAGNOSIS — E0590 Thyrotoxicosis, unspecified without thyrotoxic crisis or storm: Secondary | ICD-10-CM

## 2013-09-04 DIAGNOSIS — I10 Essential (primary) hypertension: Secondary | ICD-10-CM

## 2013-09-04 DIAGNOSIS — C50919 Malignant neoplasm of unspecified site of unspecified female breast: Secondary | ICD-10-CM

## 2013-09-04 DIAGNOSIS — E876 Hypokalemia: Secondary | ICD-10-CM

## 2013-09-04 DIAGNOSIS — E878 Other disorders of electrolyte and fluid balance, not elsewhere classified: Secondary | ICD-10-CM

## 2013-09-04 LAB — POTASSIUM: POTASSIUM: 3.5 meq/L (ref 3.5–5.1)

## 2013-09-04 NOTE — Progress Notes (Signed)
Subjective:    Patient ID: Ellen Wright, female    DOB: Sep 14, 1918, 80 y.o.   MRN: [id number]  HPI 80 year old female with past history of hypertension, hearing loss and hyperthyroidism s/p ablation who presents to clinic to follow up on these issues as well as for a complete physical exam.   She is accompanied by her daughter.  History obtained from both of them.  Her daughter states she has been doing well.  Breathing stable.  Eating well.  No nausea or vomiting.  No bowel issues reported today.  We discussed using miralax.  Overall feels she is doing well.       Past Medical History  Diagnosis Date  . Hypertension   . Hyperlipidemia   . Hyperthyroidism     s/p ablation  . Hearing loss     Current Outpatient Prescriptions on File Prior to Visit  Medication Sig Dispense Refill  . Fish Oil-Cholecalciferol (OMEGA-3 FISH OIL/VITAMIN D3) 1000-1000 MG-UNIT CAPS Take by mouth.      . potassium chloride (MICRO-K) 10 MEQ CR capsule Take 1 capsule (10 mEq total) by mouth 2 (two) times daily.  180 capsule  1  . ramipril (ALTACE) 2.5 MG capsule Take 1 capsule (2.5 mg total) by mouth daily.  90 capsule  1  . Travoprost, BAK Free, (TRAVATAN) 0.004 % SOLN ophthalmic solution 1 drop at bedtime.      . triamterene-hydrochlorothiazide (MAXZIDE-25) 37.5-25 MG per tablet Take 1 tablet by mouth daily.  90 tablet  1   No current facility-administered medications on file prior to visit.    Review of Systems Patient denies any headache, lightheadedness or dizziness. No sinus or allergy symptoms.  Persistent hearing loss.  No chest pain, tightness or palpitations.  No increased shortness of breath, cough or congestion.  No nausea or vomiting.  No acid reflux.  No abdominal pain or cramping.  No bowel change, such as diarrhea, BRBPR or melana.  Has had persistent problems with constipation.  Discussed at last visit - using miralax.   No urine change.   States eating and drinking well.  Off Femara.       Objective:   Physical Exam  Filed Vitals:   09/04/13 0929  BP: 120/70  Pulse: 89  Temp: 98 F (36.7 C)   Blood pressure recheck:  72/9  80 year old female in no acute distress.   HEENT:  Nares- clear.  Oropharynx - without lesions. NECK:  Supple.  Nontender.  No audible bruit.  HEART:  Appears to be regular. LUNGS:  No crackles or wheezing audible.  Respirations even and unlabored.  RADIAL PULSE:  Equal bilaterally.    BREASTS:  No nipple discharge or nipple retraction present.  Could not appreciate any distinct nodules or axillary adenopathy.  ABDOMEN:  Soft, nontender.  Bowel sounds present and normal.  No audible abdominal bruit.  GU:  Not performed.    EXTREMITIES:  No increased edema present.  DP pulses palpable and equal bilaterally.          Assessment & Plan:  CARDIOVASCULAR.  Symptoms stable.  No acute issues.  Has declined further w/up.  PREVIOUS WEIGHT LOSS.  Weight has been stable overall.  Follow.  Daughter reports that she is eating well.     HISTORY OF ABDOMINAL BRUIT.  Aortic ultrasound revealed no significant abnormalities.   HEALTH MAINTENANCE.  Physical today.  Scheduled mammogram previous visit.  Apparently missed appt.  Need to reschedule.  Declined GU and  rectal exam. Check cholesterol.  Declines GI evaluation.   I spent 25 minutes with the patient and her daughter and more than 50% of the time was spent in consultation regarding the above.

## 2013-09-04 NOTE — Progress Notes (Signed)
Pre visit review using our clinic review tool, if applicable. No additional management support is needed unless otherwise documented below in the visit note. 

## 2013-09-05 ENCOUNTER — Encounter: Payer: Self-pay | Admitting: *Deleted

## 2013-09-06 ENCOUNTER — Encounter: Payer: Self-pay | Admitting: Internal Medicine

## 2013-09-06 DIAGNOSIS — E876 Hypokalemia: Secondary | ICD-10-CM | POA: Insufficient documentation

## 2013-09-06 NOTE — Assessment & Plan Note (Signed)
S/p ablation.  Recent tsh wnl.

## 2013-09-06 NOTE — Assessment & Plan Note (Signed)
Back on potassium supplements now.  Recheck potassium today.

## 2013-09-06 NOTE — Assessment & Plan Note (Signed)
Off Femara.  States has been released by the [employer]. Needs her mammogram.  Will reschedule.

## 2013-09-06 NOTE — Assessment & Plan Note (Signed)
Blood pressure has been under good control.  She was off her medication last visit.  Back on now.  Blood pressure looks good.  Same medication regimen.  Follow.

## 2014-01-05 ENCOUNTER — Ambulatory Visit: Payer: PRIVATE HEALTH INSURANCE | Admitting: Internal Medicine

## 2014-01-07 ENCOUNTER — Ambulatory Visit (INDEPENDENT_AMBULATORY_CARE_PROVIDER_SITE_OTHER): Payer: PRIVATE HEALTH INSURANCE | Admitting: Internal Medicine

## 2014-01-07 ENCOUNTER — Encounter: Payer: Self-pay | Admitting: Internal Medicine

## 2014-01-07 VITALS — BP 140/80 | HR 90 | Temp 98.1°F | Ht <= 58 in | Wt 117.5 lb

## 2014-01-07 DIAGNOSIS — I10 Essential (primary) hypertension: Secondary | ICD-10-CM

## 2014-01-07 DIAGNOSIS — E0590 Thyrotoxicosis, unspecified without thyrotoxic crisis or storm: Secondary | ICD-10-CM

## 2014-01-07 DIAGNOSIS — E876 Hypokalemia: Secondary | ICD-10-CM

## 2014-01-07 DIAGNOSIS — C50919 Malignant neoplasm of unspecified site of unspecified female breast: Secondary | ICD-10-CM

## 2014-01-07 LAB — COMPREHENSIVE METABOLIC PANEL
ALT: 9 U/L (ref 0–35)
AST: 19 U/L (ref 0–37)
Albumin: 3.9 g/dL (ref 3.5–5.2)
Alkaline Phosphatase: 58 U/L (ref 39–117)
BUN: 20 mg/dL (ref 6–23)
CALCIUM: 9.4 mg/dL (ref 8.4–10.5)
CHLORIDE: 106 meq/L (ref 96–112)
CO2: 30 meq/L (ref 19–32)
Creatinine, Ser: 1.1 mg/dL (ref 0.4–1.2)
GFR: 60.87 mL/min (ref >60.00)
GLUCOSE: 83 mg/dL (ref 70–99)
POTASSIUM: 3.3 meq/L — AB (ref 3.5–5.1)
Sodium: 141 mEq/L (ref 135–145)
TOTAL PROTEIN: 8.3 g/dL (ref 6.0–8.3)
Total Bilirubin: 0.9 mg/dL (ref 0.2–1.2)

## 2014-01-07 LAB — CBC WITH DIFFERENTIAL/PLATELET
BASOS PCT: 0.2 % (ref 0.0–3.0)
Basophils Absolute: 0 10*3/uL (ref 0.0–0.1)
EOS ABS: 0.1 10*3/uL (ref 0.0–0.7)
EOS PCT: 2 % (ref 0.0–5.0)
HCT: 41.5 % (ref 36.0–46.0)
HEMOGLOBIN: 13.7 g/dL (ref 12.0–15.0)
LYMPHS ABS: 1.2 10*3/uL (ref 0.7–4.0)
Lymphocytes Relative: 31.7 % (ref 12.0–46.0)
MCHC: 32.9 g/dL (ref 30.0–36.0)
MCV: 88.4 fl (ref 78.0–100.0)
MONOS PCT: 13.1 % — AB (ref 3.0–12.0)
Monocytes Absolute: 0.5 10*3/uL (ref 0.1–1.0)
NEUTROS ABS: 2 10*3/uL (ref 1.4–7.7)
Neutrophils Relative %: 53 % (ref 43.0–77.0)
Platelets: 211 10*3/uL (ref 150.0–400.0)
RBC: 4.7 Mil/uL (ref 3.87–5.11)
RDW: 14.9 % (ref 11.5–15.5)
WBC: 3.9 10*3/uL — AB (ref 4.0–10.5)

## 2014-01-07 LAB — TSH: TSH: 1.21 u[IU]/mL (ref 0.35–4.50)

## 2014-01-07 MED ORDER — TRIAMTERENE-HCTZ 37.5-25 MG PO TABS: 1.0000 | ORAL_TABLET | Freq: Every day | ORAL | Status: DC

## 2014-01-07 MED ORDER — RAMIPRIL 2.5 MG PO CAPS: 2.5000 mg | ORAL_CAPSULE | Freq: Every day | ORAL | Status: DC

## 2014-01-07 NOTE — Progress Notes (Signed)
Pre visit review using our clinic review tool, if applicable. No additional management support is needed unless otherwise documented below in the visit note. 

## 2014-01-09 ENCOUNTER — Encounter: Payer: Self-pay | Admitting: *Deleted

## 2014-01-09 ENCOUNTER — Other Ambulatory Visit: Payer: Self-pay | Admitting: *Deleted

## 2014-01-09 MED ORDER — POTASSIUM CHLORIDE ER 10 MEQ PO CPCR: 10.0000 meq | ORAL_CAPSULE | Freq: Two times a day (BID) | ORAL | Status: DC

## 2014-01-09 MED ORDER — POTASSIUM CHLORIDE ER 10 MEQ PO CPCR
10.0000 meq | ORAL_CAPSULE | Freq: Every day | ORAL | Status: DC
Start: 2014-01-09 — End: 2014-04-29

## 2014-01-10 ENCOUNTER — Encounter: Payer: Self-pay | Admitting: Internal Medicine

## 2014-01-10 NOTE — Progress Notes (Signed)
Subjective:    Patient ID: Ellen Wright, female    DOB: 10/01/1918, 81 y.o.   MRN: [id number]  HPI 81 year old female with past history of hypertension, hearing loss and hyperthyroidism s/p ablation who presents for a scheduled follow up.   She is accompanied by her daughter.  History obtained from both of them.  Her daughter states she has been doing well.  Breathing stable.  Eating well.  Weight is up a couple of pounds from the last visit.  No nausea or vomiting.  No bowel issues reported today.   Overall feels she is doing well.       Past Medical History  Diagnosis Date  . Hypertension   . Hyperlipidemia   . Hyperthyroidism     s/p ablation  . Hearing loss     Outpatient Encounter Prescriptions as of 01/07/2014  Medication Sig  . ramipril (ALTACE) 2.5 MG capsule Take 1 capsule (2.5 mg total) by mouth daily.  Marland Kitchen triamterene-hydrochlorothiazide (MAXZIDE-25) 37.5-25 MG per tablet Take 1 tablet by mouth daily.  . [DISCONTINUED] potassium chloride (MICRO-K) 10 MEQ CR capsule Take 1 capsule (10 mEq total) by mouth 2 (two) times daily.  . [DISCONTINUED] ramipril (ALTACE) 2.5 MG capsule Take 1 capsule (2.5 mg total) by mouth daily.  . [DISCONTINUED] triamterene-hydrochlorothiazide (MAXZIDE-25) 37.5-25 MG per tablet Take 1 tablet by mouth daily.  . [DISCONTINUED] Fish Oil-Cholecalciferol (OMEGA-3 FISH OIL/VITAMIN D3) 1000-1000 MG-UNIT CAPS Take by mouth.  . [DISCONTINUED] Travoprost, BAK Free, (TRAVATAN) 0.004 % SOLN ophthalmic solution 1 drop at bedtime.    Review of Systems Patient denies any headache, lightheadedness or dizziness. No sinus or allergy symptoms.  Persistent hearing loss.  No chest pain, tightness or palpitations.  No increased shortness of breath, cough or congestion.  No nausea or vomiting.  No acid reflux.  No abdominal pain or cramping.  No bowel change, such as diarrhea, BRBPR or melana.   No urine change.   States eating and drinking well.  Off Femara.       Objective:   Physical Exam  Filed Vitals:   01/07/14 0908  BP: 140/80  Pulse: 90  Temp: 98.1 F (36.7 C)   Pulse 22  81 year old female in no acute distress.   HEENT:  Nares- clear.  Oropharynx - without lesions. NECK:  Supple.  Nontender.  No audible bruit.  HEART:  Appears to be regular. LUNGS:  No crackles or wheezing audible.  Respirations even and unlabored.  RADIAL PULSE:  Equal bilaterally. ABDOMEN:  Soft, nontender.  Bowel sounds present and normal.  No audible abdominal bruit.   EXTREMITIES:  No increased edema present.  DP pulses palpable and equal bilaterally.          Assessment & Plan:  1. Essential hypertension, benign Blood pressure has been doing well.  Elevated on my check (158-160/78).  She is off her blood pressure medication.  Restart her medication.  Take regularly.  Check pressure.  Get her back in soon to reassess.  Have her daughter spot check her pressure.  Let us know if persistent elevation.    2. Hyperthyroidism S/p ablation.  On no medication.   - TSH  3. Breast cancer, unspecified laterality Off femara.  States has been released by the [employer].  Is scheduled her mammogram.  Daughter will reschedule.   - CBC with Differential - Comprehensive metabolic panel  4. Hypokalemia Off potassium.  Recheck potassium today.    5. CARDIOVASCULAR.  Symptoms stable.  No acute issues.  Has declined further w/up.  6. PREVIOUS WEIGHT LOSS.  Weight has been stable overall.  Up a couple of pounds from the last check.   Follow.  Daughter reports that she is eating well.     7. HISTORY OF ABDOMINAL BRUIT.  Aortic ultrasound revealed no significant abnormalities.   HEALTH MAINTENANCE.  Physical 09/04/13.  Scheduled mammogram previous visit.  Apparently missed appt.  Desires mammogram.  Already scheduled.  Daughter plans to reschedule.   Declined GU and rectal exam. Check cholesterol.  Declines GI evaluation.   I spent 25 minutes with the patient and her  daughter and more than 50% of the time was spent in consultation regarding the above.

## 2014-01-12 ENCOUNTER — Telehealth: Payer: Self-pay

## 2014-01-12 NOTE — Telephone Encounter (Signed)
Spoke to pt dtr. Pt does not have any advance planning. Dtr stated that pt, her and sister are going to have a lawyer draw up some final wishes/living will paperwork.   Offered to mail the MOST and DNR forms to dtr [employer]. Those are going out into the mail now.   Sending to: [address]   [place], [place]  [phone]

## 2014-01-26 ENCOUNTER — Other Ambulatory Visit: Payer: PRIVATE HEALTH INSURANCE

## 2014-02-23 DIAGNOSIS — H4011X3 Primary open-angle glaucoma, severe stage: Secondary | ICD-10-CM | POA: Diagnosis not present

## 2014-03-15 ENCOUNTER — Encounter: Payer: Self-pay | Admitting: Internal Medicine

## 2014-03-15 ENCOUNTER — Ambulatory Visit (INDEPENDENT_AMBULATORY_CARE_PROVIDER_SITE_OTHER): Payer: 59 | Admitting: Internal Medicine

## 2014-03-15 VITALS — BP 140/80 | HR 89 | Temp 98.1°F | Ht <= 58 in | Wt 114.0 lb

## 2014-03-15 DIAGNOSIS — E876 Hypokalemia: Secondary | ICD-10-CM

## 2014-03-15 DIAGNOSIS — I10 Essential (primary) hypertension: Secondary | ICD-10-CM

## 2014-03-15 DIAGNOSIS — Z0000 Encounter for general adult medical examination without abnormal findings: Secondary | ICD-10-CM

## 2014-03-15 DIAGNOSIS — E0590 Thyrotoxicosis, unspecified without thyrotoxic crisis or storm: Secondary | ICD-10-CM

## 2014-03-15 DIAGNOSIS — C50919 Malignant neoplasm of unspecified site of unspecified female breast: Secondary | ICD-10-CM

## 2014-03-15 LAB — BASIC METABOLIC PANEL
BUN: 19 mg/dL (ref 6–23)
CALCIUM: 10.1 mg/dL (ref 8.4–10.5)
CO2: 34 mEq/L — ABNORMAL HIGH (ref 19–32)
CREATININE: 1.12 mg/dL (ref 0.40–1.20)
Chloride: 103 mEq/L (ref 96–112)
GFR: 58.35 mL/min — AB (ref >60.00)
Glucose, Bld: 93 mg/dL (ref 70–99)
Potassium: 3.5 mEq/L (ref 3.5–5.1)
Sodium: 141 mEq/L (ref 135–145)

## 2014-03-15 NOTE — Progress Notes (Signed)
Patient ID: Ellen Wright, female   DOB: Dec 16, 1918, 81 y.o.   MRN: [id number]   Subjective:    Patient ID: Ellen Wright, female    DOB: 18-May-1918, 81 y.o.   MRN: [id number]  HPI  Patient here for a scheduled follow up.  Has a history of hypertension.  Was concern last visit, that she was not taking her medications.  She is accompanied by her daughter.  History obtained from both of them.  She is taking her medications regularly now.  Pt reports that she feels good.  Breathing stable.  No cardiac symptoms with increased activity or exertion.  Bowels stable.  Some constipation.  Drinks prune juice.  Helps.  No abdominal pian.  Eating well.  Has lost a few pounds, but daughter reports appetite good.     Past Medical History  Diagnosis Date  . Hypertension   . Hyperlipidemia   . Hyperthyroidism     s/p ablation  . Hearing loss     Outpatient Encounter Prescriptions as of 03/15/2014  Medication Sig  . potassium chloride (MICRO-K) 10 MEQ CR capsule Take 1 capsule (10 mEq total) by mouth daily.  . ramipril (ALTACE) 2.5 MG capsule Take 1 capsule (2.5 mg total) by mouth daily.  Marland Kitchen triamterene-hydrochlorothiazide (MAXZIDE-25) 37.5-25 MG per tablet Take 1 tablet by mouth daily.    Review of Systems  Constitutional: Negative for activity change and appetite change.       Some minimal weight loss.   HENT: Negative for congestion and sinus pressure.   Respiratory: Negative for cough, chest tightness and shortness of breath.   Cardiovascular: Negative for chest pain, palpitations and leg swelling.  Gastrointestinal: Positive for constipation (some constipaiotn as otulined.  ). Negative for nausea, vomiting and diarrhea.  Genitourinary: Negative for dysuria and difficulty urinating.  Musculoskeletal: Negative for joint swelling.  Skin: Negative for color change and rash.  Neurological: Negative for dizziness, light-headedness and headaches.  Psychiatric/Behavioral: Negative for dysphoric mood and  agitation.       Objective:     Blood pressure recheck:  148/78, pulse 68  Physical Exam  Constitutional: She is oriented to person, place, and time. She appears well-developed and well-nourished.  HENT:  Nose: Nose normal.  Mouth/Throat: Oropharynx is clear and moist.  Neck: Neck supple. No thyromegaly present.  Cardiovascular: Normal rate and regular rhythm.   Pulmonary/Chest: Breath sounds normal. No respiratory distress. She has no wheezes.  Abdominal: Soft. Bowel sounds are normal. There is no tenderness.  Musculoskeletal: She exhibits no edema or tenderness.  Lymphadenopathy:    She has no cervical adenopathy.  Neurological: She is alert and oriented to person, place, and time.  Skin: No rash noted. No erythema.    BP 140/80 mmHg  Pulse 89  Temp(Src) 98.1 F (36.7 C) (Oral)  Ht 4\' 10"  (1.473 m)  Wt 114 lb (51.71 kg)  BMI 23.83 kg/m2  SpO2 97% Wt Readings from Last 3 Encounters:  03/15/14 114 lb (51.71 kg)  01/07/14 117 lb 8 oz (53.298 kg)  09/04/13 115 lb (52.164 kg)     Lab Results  Component Value Date   WBC 3.9* 01/07/2014   HGB 13.7 01/07/2014   HCT 41.5 01/07/2014   PLT 211.0 01/07/2014   GLUCOSE 93 03/15/2014   CHOL 148 03/27/2012   TRIG 59.0 03/27/2012   HDL 58.30 03/27/2012   LDLCALC 78 03/27/2012   ALT 9 01/07/2014   AST 19 01/07/2014   NA 141 03/15/2014   K 3.5  03/15/2014   CL 103 03/15/2014   CREATININE 1.12 03/15/2014   BUN 19 03/15/2014   CO2 34* 03/15/2014   TSH 1.21 01/07/2014       Assessment & Plan:   Problem List Items Addressed This Visit    Breast cancer    Off Femara.  Has been released by the cancer center.  Has not followed up with her mammogram.  Is ordered.  Daughter plans to schedule.        Essential hypertension, benign - Primary    Blood pressure as outlined.  Better on medication.  Continue same medication regimen.  Check potassium today.       Relevant Orders   Basic metabolic panel (Completed)   Health  care maintenance    Physical 09/04/13.  Plans to get mammogram as outlined.        Hyperthyroidism    S/p ablation.  Recent tsh wnl.  Follow.       Hypokalemia    Recheck today to confirm stable.        Relevant Orders   Basic metabolic panel (Completed)     I spent 25 minutes with the patient and more than 50% of the time was spent in consultation regarding the above, specifically obtaining history, etc.  Pt can't hear.  Questions and discussion had to be written down.      Einar Pheasant, MD

## 2014-03-15 NOTE — Progress Notes (Signed)
Pre visit review using our clinic review tool, if applicable. No additional management support is needed unless otherwise documented below in the visit note. 

## 2014-03-16 ENCOUNTER — Encounter: Payer: Self-pay | Admitting: *Deleted

## 2014-03-21 ENCOUNTER — Encounter: Payer: Self-pay | Admitting: Internal Medicine

## 2014-03-21 DIAGNOSIS — Z0000 Encounter for general adult medical examination without abnormal findings: Secondary | ICD-10-CM | POA: Insufficient documentation

## 2014-03-21 NOTE — Assessment & Plan Note (Signed)
Recheck today to confirm stable.

## 2014-03-21 NOTE — Assessment & Plan Note (Signed)
S/p ablation.  Recent tsh wnl.  Follow.

## 2014-03-21 NOTE — Assessment & Plan Note (Signed)
Physical 09/04/13.  Plans to get mammogram as outlined.

## 2014-03-21 NOTE — Assessment & Plan Note (Signed)
Off Femara.  Has been released by the cancer center.  Has not followed up with her mammogram.  Is ordered.  Daughter plans to schedule.

## 2014-03-21 NOTE — Assessment & Plan Note (Signed)
Blood pressure as outlined.  Better on medication.  Continue same medication regimen.  Check potassium today.

## 2014-04-26 ENCOUNTER — Other Ambulatory Visit: Payer: Self-pay | Admitting: Internal Medicine

## 2014-04-29 ENCOUNTER — Other Ambulatory Visit: Payer: Self-pay | Admitting: Internal Medicine

## 2014-04-30 ENCOUNTER — Other Ambulatory Visit: Payer: Self-pay

## 2014-05-11 ENCOUNTER — Other Ambulatory Visit: Payer: Self-pay | Admitting: *Deleted

## 2014-05-11 ENCOUNTER — Telehealth: Payer: Self-pay

## 2014-05-11 MED ORDER — TRIAMTERENE-HCTZ 37.5-25 MG PO TABS: 1.0000 | ORAL_TABLET | Freq: Every day | ORAL | Status: DC

## 2014-05-11 NOTE — Telephone Encounter (Signed)
The pt needs a medication called in to [facility] for her blood pressure.  However, the daughter can not remember the name of the medication, except for it has HTZ in it.   pts daughter callback - [phone]

## 2014-05-11 NOTE — Telephone Encounter (Signed)
Pt.notified

## 2014-07-06 ENCOUNTER — Other Ambulatory Visit: Payer: Self-pay | Admitting: Internal Medicine

## 2014-07-29 ENCOUNTER — Other Ambulatory Visit: Payer: Self-pay | Admitting: *Deleted

## 2014-07-29 ENCOUNTER — Other Ambulatory Visit: Payer: Self-pay | Admitting: Internal Medicine

## 2014-07-29 MED ORDER — POTASSIUM CHLORIDE ER 10 MEQ PO CPCR: 10.0000 meq | ORAL_CAPSULE | Freq: Every day | ORAL | Status: DC

## 2014-07-29 MED ORDER — RAMIPRIL 2.5 MG PO CAPS: 2.5000 mg | ORAL_CAPSULE | Freq: Every day | ORAL | Status: DC

## 2014-12-06 ENCOUNTER — Other Ambulatory Visit: Payer: Self-pay | Admitting: Internal Medicine

## 2015-04-20 ENCOUNTER — Other Ambulatory Visit: Payer: Self-pay | Admitting: Internal Medicine

## 2015-04-20 DIAGNOSIS — Z1231 Encounter for screening mammogram for malignant neoplasm of breast: Secondary | ICD-10-CM

## 2015-05-02 ENCOUNTER — Other Ambulatory Visit: Payer: Self-pay | Admitting: Internal Medicine

## 2015-05-02 ENCOUNTER — Other Ambulatory Visit: Payer: Self-pay | Admitting: *Deleted

## 2015-05-02 MED ORDER — POTASSIUM CHLORIDE ER 10 MEQ PO CPCR: 10.0000 meq | ORAL_CAPSULE | Freq: Every day | ORAL | Status: DC

## 2015-05-19 ENCOUNTER — Ambulatory Visit: Payer: 59

## 2015-06-15 ENCOUNTER — Other Ambulatory Visit: Payer: Self-pay | Admitting: Internal Medicine

## 2015-08-03 ENCOUNTER — Other Ambulatory Visit: Payer: Self-pay | Admitting: Internal Medicine

## 2015-08-03 NOTE — Telephone Encounter (Signed)
Wrong DOB.   No information in this chart.  Will need to clarify prescription.   Thanks

## 2015-08-04 NOTE — Telephone Encounter (Signed)
See attached message.  This is the wrong date of birth for this pt.

## 2015-08-16 ENCOUNTER — Other Ambulatory Visit: Payer: Self-pay

## 2015-08-16 ENCOUNTER — Other Ambulatory Visit: Payer: Self-pay | Admitting: Internal Medicine

## 2015-08-16 NOTE — Progress Notes (Unsigned)
Pt not seen since 03/15/2014.

## 2015-08-17 NOTE — Progress Notes (Signed)
I have not seen her in 1 1/2 year.  Apparently not taking medication regularly.  Needs lab appt scheduled asap.   Also needs a f/u appt with me.  Can see how she has been taking the medication.

## 2015-08-18 ENCOUNTER — Telehealth: Payer: Self-pay | Admitting: *Deleted

## 2015-08-18 MED ORDER — POTASSIUM CHLORIDE ER 10 MEQ PO CPCR: 10.0000 meq | ORAL_CAPSULE | Freq: Every day | ORAL | 0 refills | Status: DC

## 2015-08-18 NOTE — Telephone Encounter (Signed)
Daughter requested to have potassium medication sent to [facility], until she receives her mail order.

## 2015-08-18 NOTE — Progress Notes (Signed)
Will refill at [facility] tomorrow (08/19/15)

## 2015-08-18 NOTE — Telephone Encounter (Signed)
Resent to [facility].

## 2015-08-19 ENCOUNTER — Ambulatory Visit: Payer: 59 | Admitting: Internal Medicine

## 2015-10-04 ENCOUNTER — Other Ambulatory Visit: Payer: Self-pay | Admitting: Internal Medicine

## 2015-10-05 ENCOUNTER — Other Ambulatory Visit: Payer: Self-pay | Admitting: Internal Medicine

## 2015-10-31 ENCOUNTER — Ambulatory Visit (INDEPENDENT_AMBULATORY_CARE_PROVIDER_SITE_OTHER): Payer: Medicare Other | Admitting: Internal Medicine

## 2015-10-31 ENCOUNTER — Encounter: Payer: Self-pay | Admitting: Internal Medicine

## 2015-10-31 VITALS — BP 160/102 | HR 91 | Temp 98.2°F | Ht <= 58 in | Wt 106.0 lb

## 2015-10-31 DIAGNOSIS — I10 Essential (primary) hypertension: Secondary | ICD-10-CM | POA: Diagnosis not present

## 2015-10-31 DIAGNOSIS — C50919 Malignant neoplasm of unspecified site of unspecified female breast: Secondary | ICD-10-CM | POA: Diagnosis not present

## 2015-10-31 DIAGNOSIS — Z23 Encounter for immunization: Secondary | ICD-10-CM | POA: Diagnosis not present

## 2015-10-31 DIAGNOSIS — R634 Abnormal weight loss: Secondary | ICD-10-CM

## 2015-10-31 DIAGNOSIS — E876 Hypokalemia: Secondary | ICD-10-CM | POA: Diagnosis not present

## 2015-10-31 DIAGNOSIS — E0590 Thyrotoxicosis, unspecified without thyrotoxic crisis or storm: Secondary | ICD-10-CM | POA: Diagnosis not present

## 2015-10-31 LAB — HEPATIC FUNCTION PANEL
ALK PHOS: 59 U/L (ref 39–117)
ALT: 8 U/L (ref 0–35)
AST: 17 U/L (ref 0–37)
Albumin: 4 g/dL (ref 3.5–5.2)
BILIRUBIN DIRECT: -0.1 mg/dL — AB (ref 0.0–0.3)
TOTAL PROTEIN: 9 g/dL — AB (ref 6.0–8.3)
Total Bilirubin: 0.6 mg/dL (ref 0.2–1.2)

## 2015-10-31 LAB — BASIC METABOLIC PANEL
BUN: 28 mg/dL — AB (ref 6–23)
CHLORIDE: 102 meq/L (ref 96–112)
CO2: 33 mEq/L — ABNORMAL HIGH (ref 19–32)
CREATININE: 1.21 mg/dL — AB (ref 0.40–1.20)
Calcium: 10.1 mg/dL (ref 8.4–10.5)
GFR: 53.18 mL/min — ABNORMAL LOW (ref >60.00)
GLUCOSE: 84 mg/dL (ref 70–99)
Potassium: 3.8 mEq/L (ref 3.5–5.1)
Sodium: 141 mEq/L (ref 135–145)

## 2015-10-31 LAB — CBC WITH DIFFERENTIAL/PLATELET
BASOS ABS: 0 10*3/uL (ref 0.0–0.1)
Basophils Relative: 0.6 % (ref 0.0–3.0)
EOS ABS: 0.1 10*3/uL (ref 0.0–0.7)
Eosinophils Relative: 1.3 % (ref 0.0–5.0)
HEMATOCRIT: 43.9 % (ref 36.0–46.0)
HEMOGLOBIN: 14.4 g/dL (ref 12.0–15.0)
LYMPHS PCT: 28.5 % (ref 12.0–46.0)
Lymphs Abs: 1.1 10*3/uL (ref 0.7–4.0)
MCHC: 32.8 g/dL (ref 30.0–36.0)
MCV: 89.1 fl (ref 78.0–100.0)
Monocytes Absolute: 0.5 10*3/uL (ref 0.1–1.0)
Monocytes Relative: 11.6 % (ref 3.0–12.0)
Neutro Abs: 2.3 10*3/uL (ref 1.4–7.7)
Neutrophils Relative %: 58 % (ref 43.0–77.0)
PLATELETS: 210 10*3/uL (ref 150.0–400.0)
RBC: 4.93 Mil/uL (ref 3.87–5.11)
RDW: 14.8 % (ref 11.5–15.5)
WBC: 4 10*3/uL (ref 4.0–10.5)

## 2015-10-31 LAB — TSH: TSH: 1.93 u[IU]/mL (ref 0.35–4.50)

## 2015-10-31 NOTE — Progress Notes (Signed)
Patient ID: Ellen Wright, female   DOB: [birth year], 82 y.o.   MRN: [id number]   Subjective:    Patient ID: Ellen Wright, female    DOB: Nov 21, 1918, 82 y.o.   MRN: [id number]  HPI  Patient here for a scheduled follow up.  She is accompanied by her daughter.  History obtained from both of them.  I have not seen her since 03/2014.  She is hard of hearing. Had to write a lot of the questions and conversation with Ms Noblett.  She has lost weight.  She  Is eating.  No nausea or vomiting.  No abdominal pain or cramping.  States has noticed "knots" under her right arm and in her right breast.  Unclear how long has been present.  Non tender.     Past Medical History:  Diagnosis Date  . Hearing loss   . Hyperlipidemia   . Hypertension   . Hyperthyroidism    s/p ablation   History reviewed. No pertinent surgical history. Family History  Problem Relation Age of Onset  . Lung disease Brother    Social History   Social History  . Marital status: Widowed    Spouse name: N/A  . Number of children: N/A  . Years of education: N/A   Social History Main Topics  . Smoking status: Never Smoker  . Smokeless tobacco: Current User    Types: Snuff  . Alcohol use No  . Drug use: No  . Sexual activity: Not Asked   Other Topics Concern  . None   Social History Narrative  . None    Outpatient Encounter Prescriptions as of 10/31/2015  Medication Sig  . potassium chloride (MICRO-K) 10 MEQ CR capsule Take 1 capsule (10 mEq total) by mouth daily. Must keep appt on 10/31/15 for additional refills  . ramipril (ALTACE) 2.5 MG capsule TAKE 1 CAPSULE BY MOUTH  DAILY  . triamterene-hydrochlorothiazide (MAXZIDE-25) 37.5-25 MG per tablet Take 1 tablet by mouth daily.  Marland Kitchen triamterene-hydrochlorothiazide (MAXZIDE-25) 37.5-25 MG tablet Take 1 tablet by mouth  daily  . [DISCONTINUED] ramipril (ALTACE) 2.5 MG capsule Take 1 capsule (2.5 mg total) by mouth daily.  . [DISCONTINUED] ramipril (ALTACE) 2.5 MG  capsule Take 1 capsule by mouth  daily   No facility-administered encounter medications on file as of 10/31/2015.     Review of Systems  Constitutional: Positive for unexpected weight change. Negative for appetite change.  HENT: Negative for congestion and sinus pressure.   Respiratory: Negative for cough, chest tightness and shortness of breath.   Cardiovascular: Negative for chest pain, palpitations and leg swelling.  Gastrointestinal: Negative for abdominal pain, diarrhea, nausea and vomiting.  Musculoskeletal: Negative for back pain and joint swelling.  Skin: Negative for color change and rash.  Neurological: Negative for dizziness, light-headedness and headaches.  Psychiatric/Behavioral: Negative for agitation and dysphoric mood.       Objective:     Blood pressure rechecked by me:  148-150/84  Physical Exam  Constitutional: She appears well-developed and well-nourished. No distress.  HENT:  Nose: Nose normal.  Mouth/Throat: Oropharynx is clear and moist.  Neck: Neck supple. No thyromegaly present.  Cardiovascular: Normal rate and regular rhythm.   Pulmonary/Chest: Breath sounds normal. No respiratory distress. She has no wheezes.  Breast exam:  No nipple discharge or nipple retraction present.  Palpable firm nodules - right axilla and right breast.  Non tender.  No palpable nodules or axillary adenopathy - left breast.    Abdominal: Soft. Bowel sounds are  normal. There is no tenderness.  Musculoskeletal: She exhibits no edema or tenderness.  Lymphadenopathy:    She has no cervical adenopathy.  Skin: No rash noted. No erythema.  Psychiatric: She has a normal mood and affect. Her behavior is normal.    BP (!) 160/102   Pulse 91   Temp 98.2 F (36.8 C) (Oral)   Ht 4\' 10"  (1.473 m)   Wt 106 lb (48.1 kg)   SpO2 91%   BMI 22.15 kg/m  Wt Readings from Last 3 Encounters:  10/31/15 106 lb (48.1 kg)  03/15/14 114 lb (51.7 kg)  01/07/14 117 lb 8 oz (53.3 kg)     Lab  Results  Component Value Date   WBC 4.0 10/31/2015   HGB 14.4 10/31/2015   HCT 43.9 10/31/2015   PLT 210.0 10/31/2015   GLUCOSE 84 10/31/2015   CHOL 148 03/27/2012   TRIG 59.0 03/27/2012   HDL 58.30 03/27/2012   LDLCALC 78 03/27/2012   ALT 8 10/31/2015   AST 17 10/31/2015   NA 141 10/31/2015   K 3.8 10/31/2015   CL 102 10/31/2015   CREATININE 1.21 (H) 10/31/2015   BUN 28 (H) 10/31/2015   CO2 33 (H) 10/31/2015   TSH 1.93 10/31/2015       Assessment & Plan:   Problem List Items Addressed This Visit    Breast cancer ([place]) - Primary    Previously on femara.  Was previously followed by the cancer center.  Breast nodules noted on exam.  Discussed further evaluation.  Since palpable masses, will have oncology evaluate to determine best plan for further w/up and treatment.        Relevant Orders   Hepatic function panel (Completed)   Essential hypertension, benign    Blood pressure on recheck - improved.  Will hold on additional medication.  Continue current meds.  Follow pressures.  Check metabolic panel.       Relevant Orders   Basic metabolic panel (Completed)   Hyperthyroidism    S/p ablation.  Check tsh today.        Hypokalemia    Recheck potassium to confirm stable.        Loss of weight    Weight loss.  Pursue breast evaluation as outlined.  Check cbc, metabolic panel, liver panel and tsh.        Relevant Orders   CBC with Differential/Platelet (Completed)   TSH (Completed)    Other Visit Diagnoses    Encounter for immunization       Relevant Orders   Flu vaccine HIGH DOSE PF (Completed)       Einar Pheasant, MD

## 2015-10-31 NOTE — Progress Notes (Signed)
Pre visit review using our clinic review tool, if applicable. No additional management support is needed unless otherwise documented below in the visit note. 

## 2015-11-01 ENCOUNTER — Encounter: Payer: Self-pay | Admitting: Internal Medicine

## 2015-11-01 NOTE — Assessment & Plan Note (Signed)
Recheck potassium to confirm stable.   

## 2015-11-01 NOTE — Assessment & Plan Note (Signed)
Weight loss.  Pursue breast evaluation as outlined.  Check cbc, metabolic panel, liver panel and tsh.

## 2015-11-01 NOTE — Assessment & Plan Note (Signed)
Blood pressure on recheck - improved.  Will hold on additional medication.  Continue current meds.  Follow pressures.  Check metabolic panel.

## 2015-11-01 NOTE — Assessment & Plan Note (Signed)
Previously on femara.  Was previously followed by the cancer center.  Breast nodules noted on exam.  Discussed further evaluation.  Since palpable masses, will have oncology evaluate to determine best plan for further w/up and treatment.

## 2015-11-01 NOTE — Assessment & Plan Note (Signed)
S/p ablation.  Check tsh today.

## 2015-11-02 ENCOUNTER — Telehealth: Payer: Self-pay | Admitting: Internal Medicine

## 2015-11-02 NOTE — Telephone Encounter (Signed)
Daughter notified of results.

## 2015-11-02 NOTE — Telephone Encounter (Signed)
Pt daughter called returning your call regarding results. Thank you!  [employer] @ [phone]

## 2015-11-02 NOTE — Telephone Encounter (Signed)
I called the cancer center and spoke with Ellen Wright about findings on clinical exam and history.  He will review and discuss with oncology and contact me about follow up.

## 2015-11-06 NOTE — Telephone Encounter (Signed)
-----   Message from Lieutenant Diego, RN sent at 11/03/2015 11:00 AM EDT ----- Regarding: breast evaluation recommendation Hey Dr. Nicki Reaper, I spoke with Tanya Nones, one of our breast cancer navigators. She said that it would definitely be worth a consult to discuss options. She said that even with the past history of breast cancer and oral treatment that Ms. Borin would likely be a candidate for treatment with a different oral medication that would be tolerated well. Please let me know if you would like for Sheena to coordinate this. Shawn

## 2015-11-06 NOTE — Telephone Encounter (Signed)
I have tried calling this pts daughter multiple times.  Only one time did it allow me to leave a message.  I advised her to call the office on Monday.  If she calls back, please inform her the cancer center wants to see Ellen Wright.  She needs to give [place] a number where she can be reached.  Also, please continue to try and call pt.  Thanks.

## 2015-11-08 NOTE — Telephone Encounter (Signed)
Attempted to reach the patient's daughter, not able to reach via phone, not able to leave a message.

## 2015-11-12 ENCOUNTER — Other Ambulatory Visit: Payer: Self-pay | Admitting: Internal Medicine

## 2015-11-16 ENCOUNTER — Telehealth: Payer: Self-pay | Admitting: Internal Medicine

## 2015-11-16 ENCOUNTER — Other Ambulatory Visit: Payer: Self-pay

## 2015-11-16 ENCOUNTER — Other Ambulatory Visit: Payer: Self-pay | Admitting: Internal Medicine

## 2015-11-16 MED ORDER — POTASSIUM CHLORIDE ER 10 MEQ PO CPCR: 10.0000 meq | ORAL_CAPSULE | Freq: Every day | ORAL | 0 refills | Status: DC

## 2015-11-16 MED ORDER — TRIAMTERENE-HCTZ 37.5-25 MG PO TABS: 1.0000 | ORAL_TABLET | Freq: Every day | ORAL | 0 refills | Status: DC

## 2015-11-16 MED ORDER — POTASSIUM CHLORIDE ER 10 MEQ PO CPCR: 10.0000 meq | ORAL_CAPSULE | Freq: Every day | ORAL | 1 refills | Status: DC

## 2015-11-16 MED ORDER — RAMIPRIL 2.5 MG PO CAPS: 2.5000 mg | ORAL_CAPSULE | Freq: Every day | ORAL | 1 refills | Status: DC

## 2015-11-16 NOTE — Telephone Encounter (Signed)
I called and spoke to the daughter.  I had been trying to get in touch with her.  See previous phone messages.  She does not need a mammogram.  The cancer  Center just wants to see her to start treatment.  I have talked with Ellen Wright and he will arrange the appointment.  Let me know if I need to place an order for a referral.  Thanks

## 2015-11-16 NOTE — Telephone Encounter (Signed)
Vaughan Basta called requesting update on her moms mammogram. No order in. Please enter order for diagnostic mammogram with b/l ultrasounds. I may have to send her to  imaging to get this scheduled quickly. Per Vaughan Basta she has been waiting a couple weeks for this appointment.

## 2015-11-16 NOTE — Progress Notes (Signed)
rx sent in for triam/hctz #30 with no refills.

## 2015-11-16 NOTE — Telephone Encounter (Signed)
The patients daughter called wanting to know why her mother's medication was not sent to the patient's mail order company. I did explain that her mother's potassium and ramipril was sent to [facility] and her HCTZ was sent to [facility] on 11.6.17. She stated that non of her medication should have been sent to [facility] if any of her medication were to go to a local pharmacy it should have been her HCTZ. She state she would call the pharmacy and follow up on what medication refills that they received. She also stated someone from this office was suppose to call her with an appointment for her mother to have a mammogram . Lenna Sciara will follow up with scheduling the patient's Mammogram appointment. I informed the patient's daughter that we will call her back with a mammogram appointment. The patient's daughter is willing to take the patient to [facility] if necessary.

## 2015-11-25 ENCOUNTER — Telehealth: Payer: Self-pay | Admitting: *Deleted

## 2015-11-25 NOTE — Telephone Encounter (Signed)
-----   Message from Rico Junker, RN sent at 11/25/2015  8:45 AM EST ----- Regarding: RE: appt The patient is scheduled to see Dr. Grayland Ormond on 12/09/15 @ 10:45.  Daughter wanted her to be seen at the [facility] office, and we are closed next Friday for Thanksgiving.  She is ok with the date.  Sheena ----- Message ----- From: Lieutenant Diego, RN Sent: 11/24/2015   3:13 PM To: Rico Junker, RN Subject: FW: appt                                       Sheena, this is the patient you and I talked about, can you get her in to see med onc ----- Message ----- From: Einar Pheasant, MD Sent: 11/16/2015   5:03 PM To: Lieutenant Diego, RN Subject: appt                                           I finally got in touch with the daughter.  She is in agreement to schedule the appointment for her mother.  This is the lady I spoke to you about that had the palpable mass.  You can call her daughter Vaughan Basta [phone] - to schedule an appointment.  Let me know if you need more information about her.  Also, let me know if I need to do anything more.    Thanks  [employer]

## 2015-11-25 NOTE — Telephone Encounter (Signed)
Received message from Burgess Estelle, RN, that Dr. Einar Pheasant wanted to refer the patient back to medical oncology for a palpable breast mass.  Patient with a history of breast cancer and had been seen by Dr. Ma Hillock, who is no longer here.  Patient was treated with Femara in the past.  Talked with patient's daughter Vaughan Basta and she prefers to take the patient to our [place] office.  Patient is scheduled to see Dr. Grayland Ormond on Friday, 12/1/7 @ 10:45.

## 2015-12-08 NOTE — Progress Notes (Signed)
Ellen Wright  [phone] [phone]  ID: Ellen Wright OB: 1918-12-27  MR#: [id number]  XAJ#:[id number]  Patient Care Team: Einar Pheasant, MD as PCP - General (Internal Medicine)  CHIEF COMPLAINT: Right breast and axillary lymphadenopathy.  INTERVAL HISTORY: Patient is a 82 year old female who has a distant history of a right breast cancer status post lumpectomy in 2007. She did not receive any XRT or chemotherapy at that time. She was initiated on Femara and subsequently completed 5 years of treatment in approximately May 2012. Patient has mild dementia and the entire history is given by her daughter. They recently noticed an enlarging right breast mass with right axillary lymphadenopathy. Patient has offered no complaints. Her no new neurologic complaints. There is no report of any fevers or illnesses. She has no chest pain or shortness of breath. She has a fair appetite, but there there is no report of nausea, vomiting, constipation, or diarrhea. She has no urinary complaints. Patient otherwise feels well and offers no further specific complaints.  REVIEW OF SYSTEMS:   Review of Systems  Unable to perform ROS: Dementia    As per HPI. Otherwise, a complete review of systems is negative.  PAST MEDICAL HISTORY: Past Medical History:  Diagnosis Date  . Hearing loss   . Hyperlipidemia   . Hypertension   . Hyperthyroidism    s/p ablation    PAST SURGICAL HISTORY: No past surgical history on file.  FAMILY HISTORY: Family History  Problem Relation Age of Onset  . Lung disease Brother     ADVANCED DIRECTIVES (Y/N):  N  HEALTH MAINTENANCE: Social History  Substance Use Topics  . Smoking status: Never Smoker  . Smokeless tobacco: Current User    Types: Snuff  . Alcohol use No     Colonoscopy:  PAP:  Bone density:  Lipid panel:  Allergies  Allergen Reactions  . Penicillins     Current Outpatient Prescriptions  Medication Sig  Dispense Refill  . potassium chloride (MICRO-K) 10 MEQ CR capsule Take 1 capsule (10 mEq total) by mouth daily. Must keep appt on 10/31/15 for additional refills 30 capsule 0  . ramipril (ALTACE) 2.5 MG capsule Take 1 capsule (2.5 mg total) by mouth daily. 30 capsule 1  . triamterene-hydrochlorothiazide (MAXZIDE-25) 37.5-25 MG tablet Take 1 tablet by mouth  daily 90 tablet 2   No current facility-administered medications for this visit.     OBJECTIVE: Vitals:   12/09/15 1110 12/09/15 1117  BP: (!) 171/100 (!) 155/84  Pulse: 97 85  Resp: 18   Temp: (!) 96.2 F (35.7 C)      Body mass index is 22.3 kg/m.    ECOG FS:2 - Symptomatic, <50% confined to bed  General: Well-developed, well-nourished, no acute distress. Eyes: Pink conjunctiva, anicteric sclera. Breasts: Easily palpable 1-2 cm breast mass and upper outer quadrant. Multiple axillary lymph nodes are also palpable. Lungs: Clear to auscultation bilaterally. Heart: Regular rate and rhythm. No rubs, murmurs, or gallops. Abdomen: Soft, nontender, nondistended. No organomegaly noted, normoactive bowel sounds. Musculoskeletal: No edema, cyanosis, or clubbing. Neuro: Alert, answering all questions appropriately. Cranial nerves grossly intact. Skin: No rashes or petechiae noted. Psych: Normal affect. Lymphatics: No cervical, calvicular, axillary or inguinal LAD.   LAB RESULTS:  Lab Results  Component Value Date   NA 141 10/31/2015   K 3.8 10/31/2015   CL 102 10/31/2015   CO2 33 (H) 10/31/2015   GLUCOSE 84 10/31/2015   BUN 28 (H) 10/31/2015  CREATININE 1.21 (H) 10/31/2015   CALCIUM 10.1 10/31/2015   PROT 9.0 (H) 10/31/2015   ALBUMIN 4.0 10/31/2015   AST 17 10/31/2015   ALT 8 10/31/2015   ALKPHOS 59 10/31/2015   BILITOT 0.6 10/31/2015    Lab Results  Component Value Date   WBC 4.0 10/31/2015   NEUTROABS 2.3 10/31/2015   HGB 14.4 10/31/2015   HCT 43.9 10/31/2015   MCV 89.1 10/31/2015   PLT 210.0 10/31/2015   Lab  Results  Component Value Date   LABCA2 17.9 12/09/2015     STUDIES: No results found.  ASSESSMENT: Right breast mass and axillary lymphadenopathy.  PLAN:    1. Right breast mass and axillary lymphadenopathy: Highly suspicious for recurrent breast cancer. Patient's original diagnosis in 2007 was clinical stage IA ER/PR positive, HER-2 negative adenocarcinoma of the right breast. She underwent lumpectomy, but declined adjuvant XRT. She completed 5 years of Femara in May 2012. Have ordered mammogram, ultrasound, and biopsy to confirm recurrence. Patient and daughter have already declined any further surgery, radiation, or XRT. Patient would reinitiate Femara if confirmed ER/PR positive breast cancer. Return to clinic in 2 weeks to discuss the results and treatment planning if necessary.  Approximately 45 minutes was spent in discussion of which greater than 50% was consultation.  Patient expressed understanding and was in agreement with this plan. She also understands that She can call clinic at any time with any questions, concerns, or complaints.   No matching staging information was found for the patient.  Lloyd Huger, MD   12/13/2015 1:11 PM

## 2015-12-09 ENCOUNTER — Inpatient Hospital Stay: Payer: Medicare Other

## 2015-12-09 ENCOUNTER — Inpatient Hospital Stay: Payer: Medicare Other | Attending: Oncology | Admitting: Oncology

## 2015-12-09 VITALS — BP 155/84 | HR 85 | Temp 96.2°F | Resp 18 | Wt 106.7 lb

## 2015-12-09 DIAGNOSIS — E785 Hyperlipidemia, unspecified: Secondary | ICD-10-CM | POA: Diagnosis not present

## 2015-12-09 DIAGNOSIS — Z79899 Other long term (current) drug therapy: Secondary | ICD-10-CM | POA: Diagnosis not present

## 2015-12-09 DIAGNOSIS — E039 Hypothyroidism, unspecified: Secondary | ICD-10-CM | POA: Insufficient documentation

## 2015-12-09 DIAGNOSIS — R591 Generalized enlarged lymph nodes: Secondary | ICD-10-CM | POA: Diagnosis not present

## 2015-12-09 DIAGNOSIS — I10 Essential (primary) hypertension: Secondary | ICD-10-CM | POA: Diagnosis not present

## 2015-12-09 DIAGNOSIS — F0390 Unspecified dementia without behavioral disturbance: Secondary | ICD-10-CM | POA: Insufficient documentation

## 2015-12-09 DIAGNOSIS — N6310 Unspecified lump in the right breast, unspecified quadrant: Secondary | ICD-10-CM

## 2015-12-09 DIAGNOSIS — Z853 Personal history of malignant neoplasm of breast: Secondary | ICD-10-CM | POA: Diagnosis not present

## 2015-12-09 NOTE — Progress Notes (Signed)
Pt here for follow up regarding breast mass that has been there for a while per pt's family. Pt is very hard of hearing. Offers no complaints.

## 2015-12-10 LAB — CANCER ANTIGEN 27.29: CA 27.29: 17.9 U/mL (ref 0.0–38.6)

## 2015-12-15 ENCOUNTER — Other Ambulatory Visit: Payer: Self-pay | Admitting: *Deleted

## 2015-12-15 DIAGNOSIS — N6310 Unspecified lump in the right breast, unspecified quadrant: Secondary | ICD-10-CM

## 2015-12-21 NOTE — Progress Notes (Deleted)
[place]  [phone] [phone]  ID: Ellen Wright OB: 1918-06-12  MR#: [id number]  IWP#:[id number]  Patient Care Team: Einar Pheasant, MD as PCP - General (Internal Medicine)  CHIEF COMPLAINT: Right breast and axillary lymphadenopathy.  INTERVAL HISTORY: Patient is a 82 year old female who has a distant history of a right breast cancer status post lumpectomy in 2007. She did not receive any XRT or chemotherapy at that time. She was initiated on Femara and subsequently completed 5 years of treatment in approximately May 2012. Patient has mild dementia and the entire history is given by her daughter. They recently noticed an enlarging right breast mass with right axillary lymphadenopathy. Patient has offered no complaints. Her no new neurologic complaints. There is no report of any fevers or illnesses. She has no chest pain or shortness of breath. She has a fair appetite, but there there is no report of nausea, vomiting, constipation, or diarrhea. She has no urinary complaints. Patient otherwise feels well and offers no further specific complaints.  REVIEW OF SYSTEMS:   Review of Systems  Unable to perform ROS: Dementia    As per HPI. Otherwise, a complete review of systems is negative.  PAST MEDICAL HISTORY: Past Medical History:  Diagnosis Date  . Hearing loss   . Hyperlipidemia   . Hypertension   . Hyperthyroidism    s/p ablation    PAST SURGICAL HISTORY: No past surgical history on file.  FAMILY HISTORY: Family History  Problem Relation Age of Onset  . Lung disease Brother     ADVANCED DIRECTIVES (Y/N):  N  HEALTH MAINTENANCE: Social History  Substance Use Topics  . Smoking status: Never Smoker  . Smokeless tobacco: Current User    Types: Snuff  . Alcohol use No     Colonoscopy:  PAP:  Bone density:  Lipid panel:  Allergies  Allergen Reactions  . Penicillins     Current Outpatient Prescriptions  Medication Sig  Dispense Refill  . potassium chloride (MICRO-K) 10 MEQ CR capsule Take 1 capsule (10 mEq total) by mouth daily. Must keep appt on 10/31/15 for additional refills 30 capsule 0  . ramipril (ALTACE) 2.5 MG capsule Take 1 capsule (2.5 mg total) by mouth daily. 30 capsule 1  . triamterene-hydrochlorothiazide (MAXZIDE-25) 37.5-25 MG tablet Take 1 tablet by mouth  daily 90 tablet 2   No current facility-administered medications for this visit.     OBJECTIVE: There were no vitals filed for this visit.   There is no height or weight on file to calculate BMI.    ECOG FS:2 - Symptomatic, <50% confined to bed  General: Well-developed, well-nourished, no acute distress. Eyes: Pink conjunctiva, anicteric sclera. Breasts: Easily palpable 1-2 cm breast mass and upper outer quadrant. Multiple axillary lymph nodes are also palpable. Lungs: Clear to auscultation bilaterally. Heart: Regular rate and rhythm. No rubs, murmurs, or gallops. Abdomen: Soft, nontender, nondistended. No organomegaly noted, normoactive bowel sounds. Musculoskeletal: No edema, cyanosis, or clubbing. Neuro: Alert, answering all questions appropriately. Cranial nerves grossly intact. Skin: No rashes or petechiae noted. Psych: Normal affect. Lymphatics: No cervical, calvicular, axillary or inguinal LAD.   LAB RESULTS:  Lab Results  Component Value Date   NA 141 10/31/2015   K 3.8 10/31/2015   CL 102 10/31/2015   CO2 33 (H) 10/31/2015   GLUCOSE 84 10/31/2015   BUN 28 (H) 10/31/2015   CREATININE 1.21 (H) 10/31/2015   CALCIUM 10.1 10/31/2015   PROT 9.0 (H) 10/31/2015   ALBUMIN  4.0 10/31/2015   AST 17 10/31/2015   ALT 8 10/31/2015   ALKPHOS 59 10/31/2015   BILITOT 0.6 10/31/2015    Lab Results  Component Value Date   WBC 4.0 10/31/2015   NEUTROABS 2.3 10/31/2015   HGB 14.4 10/31/2015   HCT 43.9 10/31/2015   MCV 89.1 10/31/2015   PLT 210.0 10/31/2015   Lab Results  Component Value Date   LABCA2 17.9 12/09/2015      STUDIES: No results found.  ASSESSMENT: Right breast mass and axillary lymphadenopathy.  PLAN:    1. Right breast mass and axillary lymphadenopathy: Highly suspicious for recurrent breast cancer. Patient's original diagnosis in 2007 was clinical stage IA ER/PR positive, HER-2 negative adenocarcinoma of the right breast. She underwent lumpectomy, but declined adjuvant XRT. She completed 5 years of Femara in May 2012. Have ordered mammogram, ultrasound, and biopsy to confirm recurrence. Patient and daughter have already declined any further surgery, radiation, or XRT. Patient would reinitiate Femara if confirmed ER/PR positive breast cancer. Return to clinic in 2 weeks to discuss the results and treatment planning if necessary.  Approximately 45 minutes was spent in discussion of which greater than 50% was consultation.  Patient expressed understanding and was in agreement with this plan. She also understands that She can call clinic at any time with any questions, concerns, or complaints.   No matching staging information was found for the patient.  Lloyd Huger, MD   12/21/2015 11:52 PM

## 2015-12-23 ENCOUNTER — Inpatient Hospital Stay: Payer: Medicare Other | Admitting: Oncology

## 2015-12-31 ENCOUNTER — Other Ambulatory Visit: Payer: Self-pay | Admitting: Internal Medicine

## 2016-01-02 ENCOUNTER — Other Ambulatory Visit: Payer: Self-pay | Admitting: Internal Medicine

## 2016-01-04 ENCOUNTER — Other Ambulatory Visit: Payer: Medicare Other

## 2016-01-04 ENCOUNTER — Ambulatory Visit: Admission: RE | Admit: 2016-01-04 | Payer: Medicare Other | Source: Ambulatory Visit

## 2016-01-19 ENCOUNTER — Other Ambulatory Visit: Payer: Self-pay | Admitting: Internal Medicine

## 2016-01-19 DIAGNOSIS — H2512 Age-related nuclear cataract, left eye: Secondary | ICD-10-CM | POA: Diagnosis not present

## 2016-01-19 DIAGNOSIS — H401133 Primary open-angle glaucoma, bilateral, severe stage: Secondary | ICD-10-CM | POA: Diagnosis not present

## 2016-01-23 ENCOUNTER — Other Ambulatory Visit: Payer: Medicare Other

## 2016-01-23 ENCOUNTER — Ambulatory Visit: Payer: Medicare Other

## 2016-01-31 ENCOUNTER — Ambulatory Visit: Payer: Medicare Other | Admitting: Internal Medicine

## 2016-02-02 ENCOUNTER — Ambulatory Visit
Admission: RE | Admit: 2016-02-02 | Discharge: 2016-02-02 | Disposition: A | Discharge status: Home / Self Care | Payer: Medicare Other | Source: Ambulatory Visit | Attending: Oncology | Admitting: Oncology

## 2016-02-02 DIAGNOSIS — N6310 Unspecified lump in the right breast, unspecified quadrant: Secondary | ICD-10-CM

## 2016-02-02 DIAGNOSIS — N6311 Unspecified lump in the right breast, upper outer quadrant: Secondary | ICD-10-CM | POA: Diagnosis not present

## 2016-02-02 HISTORY — DX: Malignant neoplasm of unspecified site of unspecified female breast: C50.919

## 2016-02-03 ENCOUNTER — Other Ambulatory Visit: Payer: Self-pay | Admitting: Oncology

## 2016-02-03 DIAGNOSIS — R928 Other abnormal and inconclusive findings on diagnostic imaging of breast: Secondary | ICD-10-CM

## 2016-02-03 DIAGNOSIS — N6310 Unspecified lump in the right breast, unspecified quadrant: Secondary | ICD-10-CM

## 2016-02-12 ENCOUNTER — Emergency Department: Payer: Medicare Other

## 2016-02-12 ENCOUNTER — Encounter: Payer: Self-pay | Admitting: Emergency Medicine

## 2016-02-12 ENCOUNTER — Observation Stay
Admission: EM | Admit: 2016-02-12 | Discharge: 2016-02-13 | Disposition: A | Discharge status: Home / Self Care | Payer: Medicare Other | Attending: Internal Medicine | Admitting: Internal Medicine

## 2016-02-12 DIAGNOSIS — Z853 Personal history of malignant neoplasm of breast: Secondary | ICD-10-CM | POA: Diagnosis not present

## 2016-02-12 DIAGNOSIS — Z66 Do not resuscitate: Secondary | ICD-10-CM | POA: Insufficient documentation

## 2016-02-12 DIAGNOSIS — F17290 Nicotine dependence, other tobacco product, uncomplicated: Secondary | ICD-10-CM | POA: Diagnosis not present

## 2016-02-12 DIAGNOSIS — J101 Influenza due to other identified influenza virus with other respiratory manifestations: Secondary | ICD-10-CM | POA: Insufficient documentation

## 2016-02-12 DIAGNOSIS — R55 Syncope and collapse: Secondary | ICD-10-CM | POA: Diagnosis not present

## 2016-02-12 DIAGNOSIS — Z79899 Other long term (current) drug therapy: Secondary | ICD-10-CM | POA: Diagnosis not present

## 2016-02-12 DIAGNOSIS — J40 Bronchitis, not specified as acute or chronic: Secondary | ICD-10-CM

## 2016-02-12 DIAGNOSIS — R0603 Acute respiratory distress: Secondary | ICD-10-CM | POA: Diagnosis not present

## 2016-02-12 DIAGNOSIS — J209 Acute bronchitis, unspecified: Secondary | ICD-10-CM

## 2016-02-12 DIAGNOSIS — R0902 Hypoxemia: Secondary | ICD-10-CM | POA: Diagnosis not present

## 2016-02-12 DIAGNOSIS — I10 Essential (primary) hypertension: Secondary | ICD-10-CM | POA: Diagnosis not present

## 2016-02-12 DIAGNOSIS — J9690 Respiratory failure, unspecified, unspecified whether with hypoxia or hypercapnia: Secondary | ICD-10-CM | POA: Diagnosis not present

## 2016-02-12 LAB — URINALYSIS, COMPLETE (UACMP) WITH MICROSCOPIC
Bilirubin Urine: NEGATIVE
Glucose, UA: NEGATIVE mg/dL
KETONES UR: NEGATIVE mg/dL
Leukocytes, UA: NEGATIVE
Nitrite: NEGATIVE
PROTEIN: 100 mg/dL — AB
Specific Gravity, Urine: 1.014 (ref 1.005–1.030)
pH: 6 (ref 5.0–8.0)

## 2016-02-12 LAB — CBC WITH DIFFERENTIAL/PLATELET
Basophils Absolute: 0 10*3/uL (ref 0–0.1)
Basophils Relative: 1 %
EOS PCT: 1 %
Eosinophils Absolute: 0 10*3/uL (ref 0–0.7)
HEMATOCRIT: 40.3 % (ref 35.0–47.0)
HEMOGLOBIN: 13.2 g/dL (ref 12.0–16.0)
LYMPHS ABS: 0.6 10*3/uL — AB (ref 1.0–3.6)
LYMPHS PCT: 14 %
MCH: 29.1 pg (ref 26.0–34.0)
MCHC: 32.6 g/dL (ref 32.0–36.0)
MCV: 89.1 fL (ref 80.0–100.0)
Monocytes Absolute: 0.7 10*3/uL (ref 0.2–0.9)
Monocytes Relative: 15 %
Neutro Abs: 3.4 10*3/uL (ref 1.4–6.5)
Neutrophils Relative %: 69 %
PLATELETS: 150 10*3/uL (ref 150–440)
RBC: 4.53 MIL/uL (ref 3.80–5.20)
RDW: 14.7 % — ABNORMAL HIGH (ref 11.5–14.5)
WBC: 4.8 10*3/uL (ref 3.6–11.0)

## 2016-02-12 LAB — COMPREHENSIVE METABOLIC PANEL
ALBUMIN: 3.6 g/dL (ref 3.5–5.0)
ALT: 11 U/L — ABNORMAL LOW (ref 14–54)
AST: 27 U/L (ref 15–41)
Alkaline Phosphatase: 60 U/L (ref 38–126)
Anion gap: 8 (ref 5–15)
BUN: 19 mg/dL (ref 6–20)
CHLORIDE: 102 mmol/L (ref 101–111)
CO2: 29 mmol/L (ref 22–32)
Calcium: 9 mg/dL (ref 8.9–10.3)
Creatinine, Ser: 1.21 mg/dL — ABNORMAL HIGH (ref 0.44–1.00)
GFR calc Af Amer: 43 mL/min — ABNORMAL LOW (ref >60)
GFR calc non Af Amer: 37 mL/min — ABNORMAL LOW (ref >60)
GLUCOSE: 100 mg/dL — AB (ref 65–99)
POTASSIUM: 3.9 mmol/L (ref 3.5–5.1)
Sodium: 139 mmol/L (ref 135–145)
Total Bilirubin: 0.9 mg/dL (ref 0.3–1.2)
Total Protein: 8.5 g/dL — ABNORMAL HIGH (ref 6.5–8.1)

## 2016-02-12 LAB — TROPONIN I

## 2016-02-12 LAB — INFLUENZA PANEL BY PCR (TYPE A & B)
INFLAPCR: POSITIVE — AB
INFLBPCR: NEGATIVE

## 2016-02-12 LAB — TSH: TSH: 1.553 u[IU]/mL (ref 0.350–4.500)

## 2016-02-12 MED ORDER — ACETAMINOPHEN 650 MG RE SUPP: 650.0000 mg | Freq: Four times a day (QID) | RECTAL | Status: DC | PRN

## 2016-02-12 MED ORDER — DOCUSATE SODIUM 100 MG PO CAPS
100.0000 mg | ORAL_CAPSULE | Freq: Two times a day (BID) | ORAL | Status: DC
  Administered 2016-02-12 – 2016-02-13 (×2): 100 mg via ORAL
  Filled 2016-02-12 (×2): qty 1

## 2016-02-12 MED ORDER — HEPARIN SODIUM (PORCINE) 5000 UNIT/ML IJ SOLN
5000.0000 [IU] | Freq: Three times a day (TID) | INTRAMUSCULAR | Status: DC
  Administered 2016-02-12 – 2016-02-13 (×2): 5000 [IU] via SUBCUTANEOUS
  Filled 2016-02-12 (×2): qty 1

## 2016-02-12 MED ORDER — POTASSIUM CHLORIDE ER 10 MEQ PO CPCR
10.0000 meq | ORAL_CAPSULE | Freq: Every day | ORAL | Status: DC
  Filled 2016-02-12 (×2): qty 1

## 2016-02-12 MED ORDER — LATANOPROST 0.005 % OP SOLN
1.0000 [drp] | Freq: Every day | OPHTHALMIC | Status: DC
  Administered 2016-02-12: 1 [drp] via OPHTHALMIC
  Filled 2016-02-12: qty 2.5

## 2016-02-12 MED ORDER — METHYLPREDNISOLONE SODIUM SUCC 125 MG IJ SOLR
60.0000 mg | Freq: Two times a day (BID) | INTRAMUSCULAR | Status: DC
  Administered 2016-02-13: 60 mg via INTRAVENOUS
  Filled 2016-02-12: qty 2

## 2016-02-12 MED ORDER — ONDANSETRON HCL 4 MG/2ML IJ SOLN: 4.0000 mg | Freq: Four times a day (QID) | INTRAMUSCULAR | Status: DC | PRN

## 2016-02-12 MED ORDER — PANTOPRAZOLE SODIUM 40 MG PO TBEC
40.0000 mg | DELAYED_RELEASE_TABLET | Freq: Every day | ORAL | Status: DC
  Administered 2016-02-12 – 2016-02-13 (×2): 40 mg via ORAL
  Filled 2016-02-12 (×2): qty 1

## 2016-02-12 MED ORDER — MAGNESIUM SULFATE 2 GM/50ML IV SOLN
2.0000 g | Freq: Once | INTRAVENOUS | Status: AC
  Administered 2016-02-12: 2 g via INTRAVENOUS
  Filled 2016-02-12: qty 50

## 2016-02-12 MED ORDER — TRIAMTERENE-HCTZ 37.5-25 MG PO TABS
1.0000 | ORAL_TABLET | Freq: Every day | ORAL | Status: DC
  Administered 2016-02-12 – 2016-02-13 (×2): 1 via ORAL
  Filled 2016-02-12 (×2): qty 1

## 2016-02-12 MED ORDER — BISACODYL 10 MG RE SUPP: 10.0000 mg | Freq: Every day | RECTAL | Status: DC | PRN

## 2016-02-12 MED ORDER — IPRATROPIUM-ALBUTEROL 0.5-2.5 (3) MG/3ML IN SOLN
3.0000 mL | Freq: Four times a day (QID) | RESPIRATORY_TRACT | Status: DC
  Administered 2016-02-12 (×2): 3 mL via RESPIRATORY_TRACT
  Filled 2016-02-12 (×3): qty 3

## 2016-02-12 MED ORDER — ALBUTEROL SULFATE (2.5 MG/3ML) 0.083% IN NEBU
7.5000 mg | INHALATION_SOLUTION | Freq: Once | RESPIRATORY_TRACT | Status: AC
  Administered 2016-02-12: 7.5 mg via RESPIRATORY_TRACT
  Filled 2016-02-12: qty 9

## 2016-02-12 MED ORDER — ONDANSETRON HCL 4 MG PO TABS: 4.0000 mg | ORAL_TABLET | Freq: Four times a day (QID) | ORAL | Status: DC | PRN

## 2016-02-12 MED ORDER — OSELTAMIVIR PHOSPHATE 30 MG PO CAPS
30.0000 mg | ORAL_CAPSULE | Freq: Every day | ORAL | Status: DC
  Administered 2016-02-12 – 2016-02-13 (×2): 30 mg via ORAL
  Filled 2016-02-12 (×2): qty 1

## 2016-02-12 MED ORDER — GUAIFENESIN ER 600 MG PO TB12
600.0000 mg | ORAL_TABLET | Freq: Two times a day (BID) | ORAL | Status: DC
  Administered 2016-02-12 – 2016-02-13 (×2): 600 mg via ORAL
  Filled 2016-02-12 (×3): qty 1

## 2016-02-12 MED ORDER — ACETAMINOPHEN 325 MG PO TABS: 650.0000 mg | ORAL_TABLET | Freq: Four times a day (QID) | ORAL | Status: DC | PRN

## 2016-02-12 MED ORDER — DEXTROSE 5 % IV SOLN
500.0000 mg | INTRAVENOUS | Status: DC
  Administered 2016-02-13: 500 mg via INTRAVENOUS
  Filled 2016-02-12 (×2): qty 500

## 2016-02-12 MED ORDER — DEXTROSE 5 % IV SOLN
500.0000 mg | INTRAVENOUS | Status: DC
  Administered 2016-02-12: 500 mg via INTRAVENOUS
  Filled 2016-02-12: qty 500

## 2016-02-12 MED ORDER — RAMIPRIL 2.5 MG PO CAPS
2.5000 mg | ORAL_CAPSULE | Freq: Every day | ORAL | Status: DC
  Administered 2016-02-12 – 2016-02-13 (×2): 2.5 mg via ORAL
  Filled 2016-02-12 (×2): qty 1

## 2016-02-12 MED ORDER — METHYLPREDNISOLONE SODIUM SUCC 125 MG IJ SOLR
125.0000 mg | Freq: Once | INTRAMUSCULAR | Status: AC
  Administered 2016-02-12: 125 mg via INTRAVENOUS
  Filled 2016-02-12: qty 2

## 2016-02-12 MED ORDER — MOMETASONE FURO-FORMOTEROL FUM 200-5 MCG/ACT IN AERO
2.0000 | INHALATION_SPRAY | Freq: Two times a day (BID) | RESPIRATORY_TRACT | Status: DC
  Administered 2016-02-13: 2 via RESPIRATORY_TRACT
  Filled 2016-02-12 (×2): qty 8.8

## 2016-02-12 NOTE — Progress Notes (Signed)
PT Cancellation Note  Patient Details Name: Quentella Mcguffie MRN: [id number] DOB: 11-19-1918   Cancelled Treatment:    Reason Eval/Treat Not Completed: Patient declined, no reason specified. Patient with family member, per family member she has not eaten all day, and is currently eating dinner. PT will re-attempt mobility evaluation when patient is more able to participate in therapy session.   Kerman Passey, PT, DPT    02/12/2016, 4:31 PM

## 2016-02-12 NOTE — Progress Notes (Signed)
Patient has a hard lump on right breast. Per her daughter, patient has an appointment at [facility] on Thursday 02/16/16.

## 2016-02-12 NOTE — Clinical Social Work Note (Signed)
CSW received consult for placement. CSW following pending PT recommendations.  Santiago Bumpers, MSW, Latanya Presser [phone]

## 2016-02-12 NOTE — Progress Notes (Signed)
Pt c/o right leg cramps, seems uncomfortable on the bed. Pt's family requested for something to help with the cramping, on call Dr Leslye Peer called and ordered for K pad and one time dose MgSo4 2g IV. Will administer as ordered.

## 2016-02-12 NOTE — ED Notes (Addendum)
Pt sats to 87% on RA, MD at bedisde. Pt placed on 2L [place] at this time

## 2016-02-12 NOTE — H&P (Signed)
History and Physical    Ellen Wright [id number] DOB: [birth year] DOA: 02/12/2016  Referring physician: Dr. Clearnce Hasten PCP: Einar Pheasant, MD  Specialists: none  Chief Complaint: cough with syncope  HPI: Ellen Wright is a 83 y.o. female has a past medical history significant for breast cancer, HTN, and HLD who presents with worsening cough and malaise with syncopal episode earlier today. In ER, pt was hypoxic and in mild respiratory distress. She is now admitted. Please note that the pt is hard of hearing and does not answer questions. Hx comes from her son  Review of Systems: unable to obtain from pt  Past Medical History:  Diagnosis Date  . Breast cancer ([place]) 2006   right breast cancer  . Hearing loss   . Hyperlipidemia   . Hypertension   . Hyperthyroidism    s/p ablation   Past Surgical History:  Procedure Laterality Date  . BREAST EXCISIONAL BIOPSY Right 2007   positive   Social History:  reports that she has never smoked. Her smokeless tobacco use includes Snuff. She reports that she does not drink alcohol or use drugs.  Allergies  Allergen Reactions  . Penicillins     Family History  Problem Relation Age of Onset  . Lung disease Brother     Prior to Admission medications   Medication Sig Start Date End Date Taking? Authorizing Provider  potassium chloride (MICRO-K) 10 MEQ CR capsule TAKE 1 CAPSULE BY MOUTH  DAILY 01/03/16   Einar Pheasant, MD  ramipril (ALTACE) 2.5 MG capsule TAKE 1 CAPSULE BY MOUTH  DAILY 01/03/16   Einar Pheasant, MD  triamterene-hydrochlorothiazide (MAXZIDE-25) 37.5-25 MG tablet TAKE 1 TABLET BY MOUTH  DAILY 01/19/16   Einar Pheasant, MD   Physical Exam: Vitals:   02/12/16 1113 02/12/16 1230 02/12/16 1241 02/12/16 1245  BP:   (!) 161/94   Pulse:  85 84 88  Resp:  17 (!) 9 (!) 21  Temp:      TempSrc:      SpO2:  96% 95% (!) 88%  Weight: 46.7 kg (103 lb)        General:  No apparent distress, frail/cachectic, [place]/AT  Eyes:  PERRL, EOMI, no scleral icterus, conjunctiva clear  ENT: moist oropharynx without exudate, TM benign, dentition poor  Neck: supple, no lymphadenopathy. No bruits or thyromegaly  Cardiovascular: regular rate without MRG; 2+ peripheral pulses, no JVD, no peripheral edema  Respiratory: diffuse rhonchi with scattered wheezes. No dullness or rales. Respiratory effort increased  Abdomen: soft, non tender to palpation, positive bowel sounds, no guarding, no rebound  Skin: no rashes or lesions  Musculoskeletal: normal bulk and tone, no joint swelling  Psychiatric: normal mood and affect, A&OX3  Neurologic: CN 2-12 grossly intact, Motor strength 5/5 in all 4 groups with symmetric DTR's and non-focal sensory exam  Labs on Admission:  Basic Metabolic Panel:  Recent Labs Lab 02/12/16 1110  NA 139  K 3.9  CL 102  CO2 29  GLUCOSE 100*  BUN 19  CREATININE 1.21*  CALCIUM 9.0   Liver Function Tests:  Recent Labs Lab 02/12/16 1110  AST 27  ALT 11*  ALKPHOS 60  BILITOT 0.9  PROT 8.5*  ALBUMIN 3.6   No results for input(s): LIPASE, AMYLASE in the last 168 hours. No results for input(s): AMMONIA in the last 168 hours. CBC:  Recent Labs Lab 02/12/16 1110  WBC 4.8  NEUTROABS 3.4  HGB 13.2  HCT 40.3  MCV 89.1  PLT 150  Cardiac Enzymes:  Recent Labs Lab 02/12/16 1110  TROPONINI <0.03    BNP (last 3 results) No results for input(s): BNP in the last 8760 hours.  ProBNP (last 3 results) No results for input(s): PROBNP in the last 8760 hours.  CBG: No results for input(s): GLUCAP in the last 168 hours.  Radiological Exams on Admission: Dg Chest 2 View  Result Date: 02/12/2016 CLINICAL DATA:  Syncopal episode today.  Lethargy. EXAM: CHEST  2 VIEW COMPARISON:  None. FINDINGS: Marked cardiac enlargement. Could not exclude a pericardial effusion. There is tortuosity and calcification of the thoracic aorta. No acute pulmonary findings. No pleural effusion. The bony  thorax is intact. IMPRESSION: Enlargement of the cardiopericardial silhouette. No acute pulmonary findings. Electronically Signed   By: Marijo Sanes M.D.   On: 02/12/2016 11:36   Ct Head Wo Contrast  Result Date: 02/12/2016 CLINICAL DATA:  Syncope. EXAM: CT HEAD WITHOUT CONTRAST TECHNIQUE: Contiguous axial images were obtained from the base of the skull through the vertex without intravenous contrast. COMPARISON:  None. FINDINGS: Brain: Mild chronic ischemic white matter disease is noted. No midline shift is noted. Ventricular size is within normal limits. There is no evidence of hemorrhage or acute infarction 23 x 17 mm calcification is seen in the left cerebellopontine angle most consistent with calcified meningioma. Vascular: No hyperdense vessel or unexpected calcification. Skull: Normal. Negative for fracture or focal lesion. Sinuses/Orbits: No acute finding. Other: None. IMPRESSION: Mild chronic ischemic white matter disease. 23 x 17 mm calcifications seen in the left cerebellopontine angle most consistent with calcified meningioma. No other intracranial abnormality seen. Electronically Signed   By: Marijo Conception, M.D.   On: 02/12/2016 12:00    EKG: Independently reviewed.  Assessment/Plan Principal Problem:   Acute respiratory distress Active Problems:   Essential hypertension, benign   Acute bronchitis   Syncope   Will admit to floor with O2, SVN's, IV ABX, and IV steroids. Consult PT and CSW. Repeat labs in AM  Diet: regular Fluids: NS@75  DVT Prophylaxis: SQ Heparin  Code Status: FULL  Family Communication: yes  Disposition Plan: home  Time spent: 50 min

## 2016-02-12 NOTE — Progress Notes (Signed)
tamiful dose changed to 30mg  daily due to crcl 10-46ml/min per protocol  Ramond Dial, Pharm.D, BCPS Clinical Pharmacist

## 2016-02-12 NOTE — ED Provider Notes (Signed)
[facility] Emergency Department Provider Note  ____________________________________________   First MD Initiated Contact with Patient 02/12/16 1051     (approximate)  I have reviewed the triage vital signs and the nursing notes.   HISTORY  Chief Complaint Weakness   HPI Ellen Wright is a 83 y.o. female with a history of hypertension, hyperlipidemia and hearing loss who is presenting to the emergency department today with altered mental status and syncope. Per the patient's family the patient fell leaning up against a wall this morning and then fell to the ground and was unconscious for about 5 minutes. The patient was found to have an oxygen saturation of 88% by medics and was placed on nasal cannula oxygen is now 100%. Patient without any statement of complaint at this time.   Past Medical History:  Diagnosis Date  . Breast cancer ([place]) 2006   right breast cancer  . Hearing loss   . Hyperlipidemia   . Hypertension   . Hyperthyroidism    s/p ablation    Patient Active Problem List   Diagnosis Date Noted  . Loss of weight 10/31/2015  . Health care maintenance 03/21/2014  . Hypokalemia 09/06/2013  . Essential hypertension, benign 03/27/2012  . Hyperthyroidism 03/27/2012  . Breast mass, right 03/27/2012    Past Surgical History:  Procedure Laterality Date  . BREAST EXCISIONAL BIOPSY Right 2007   positive    Prior to Admission medications   Medication Sig Start Date End Date Taking? Authorizing Provider  potassium chloride (MICRO-K) 10 MEQ CR capsule TAKE 1 CAPSULE BY MOUTH  DAILY 01/03/16   Einar Pheasant, MD  ramipril (ALTACE) 2.5 MG capsule TAKE 1 CAPSULE BY MOUTH  DAILY 01/03/16   Einar Pheasant, MD  triamterene-hydrochlorothiazide (MAXZIDE-25) 37.5-25 MG tablet TAKE 1 TABLET BY MOUTH  DAILY 01/19/16   Einar Pheasant, MD    Allergies Penicillins  Family History  Problem Relation Age of Onset  . Lung disease Brother     Social  History Social History  Substance Use Topics  . Smoking status: Never Smoker  . Smokeless tobacco: Current User    Types: Snuff  . Alcohol use No    Review of Systems Level 5 caveat secondary to patient being nonverbal. ____________________________________________   PHYSICAL EXAM:  VITAL SIGNS: ED Triage Vitals  Enc Vitals Group     BP      Pulse      Resp      Temp      Temp src      SpO2      Weight      Height      Head Circumference      Peak Flow      Pain Score      Pain Loc      Pain Edu?      Excl. in [place]?     Constitutional: Alert and in no acute distress. Eyes: Conjunctivae are normal. PERRL. EOMI. Head: Atraumatic. Nose: No congestion/rhinnorhea. Mouth/Throat: Mucous membranes are moist.   Neck: No stridor.   Cardiovascular: Normal rate, regular rhythm. Grossly normal heart sounds.  Respiratory: Normal respiratory effort.  No retractions. Prolonged expiratory phase. Gastrointestinal: Soft and nontender. No distention.  Musculoskeletal: No lower extremity tenderness nor edema.  No joint effusions. Neurologic:  No gross focal neurologic deficits are appreciated.  Skin:  Skin is warm, dry and intact. No rash noted.   ____________________________________________   LABS (all labs ordered are listed, but only abnormal results  are displayed)  Labs Reviewed  COMPREHENSIVE METABOLIC PANEL - Abnormal; Notable for the following:       Result Value   Glucose, Bld 100 (*)    Creatinine, Ser 1.21 (*)    Total Protein 8.5 (*)    ALT 11 (*)    GFR calc non Af Amer 37 (*)    GFR calc Af Amer 43 (*)    All other components within normal limits  CBC WITH DIFFERENTIAL/PLATELET - Abnormal; Notable for the following:    RDW 14.7 (*)    Lymphs Abs 0.6 (*)    All other components within normal limits  RAPID INFLUENZA A&B ANTIGENS ([facility] ONLY)  TSH  TROPONIN I  URINALYSIS, COMPLETE (UACMP) WITH MICROSCOPIC    ____________________________________________  EKG  ED ECG REPORT I, Doran Stabler, the attending physician, personally viewed and interpreted this ECG.   Date: 02/12/2016  EKG Time: 1054  Rate: 90  Rhythm: normal sinus rhythm  Axis: normal  Intervals:right bundle branch block  ST&T Change: No ST segment elevation or depression. No abnormal T-wave inversion.  ____________________________________________  RADIOLOGY  CT Head Wo Contrast (Final result)  Result time 02/12/16 12:00:10  Final result by Sabino Dick, MD (02/12/16 12:00:10)           Narrative:   CLINICAL DATA: Syncope.  EXAM: CT HEAD WITHOUT CONTRAST  TECHNIQUE: Contiguous axial images were obtained from the base of the skull through the vertex without intravenous contrast.  COMPARISON: None.  FINDINGS: Brain: Mild chronic ischemic white matter disease is noted. No midline shift is noted. Ventricular size is within normal limits. There is no evidence of hemorrhage or acute infarction 23 x 17 mm calcification is seen in the left cerebellopontine angle most consistent with calcified meningioma.  Vascular: No hyperdense vessel or unexpected calcification.  Skull: Normal. Negative for fracture or focal lesion.  Sinuses/Orbits: No acute finding.  Other: None.  IMPRESSION: Mild chronic ischemic white matter disease.  23 x 17 mm calcifications seen in the left cerebellopontine angle most consistent with calcified meningioma.  No other intracranial abnormality seen.   Electronically Signed By: Marijo Conception, M.D. On: 02/12/2016 12:00            DG Chest 2 View (Final result)  Result time 02/12/16 11:36:28  Final result by Kalman Jewels, MD (02/12/16 11:36:28)           Narrative:   CLINICAL DATA: Syncopal episode today. Lethargy.  EXAM: CHEST 2 VIEW  COMPARISON: None.  FINDINGS: Marked cardiac enlargement. Could not exclude a pericardial effusion. There is  tortuosity and calcification of the thoracic aorta. No acute pulmonary findings. No pleural effusion. The bony thorax is intact.  IMPRESSION: Enlargement of the cardiopericardial silhouette.  No acute pulmonary findings.   Electronically Signed By: Marijo Sanes M.D. On: 02/12/2016 11:36         ____________________________________________   PROCEDURES  Procedure(s) performed:   Procedures  Critical Care performed:   ____________________________________________   INITIAL IMPRESSION / ASSESSMENT AND PLAN / ED COURSE  Pertinent labs & imaging results that were available during my care of the patient were reviewed by me and considered in my medical decision making (see chart for details).  ----------------------------------------- 12:54 PM on 02/12/2016 -----------------------------------------  On room air the patient was 86%. Her son is now the bedside and says that the patient and is lightly decreased mentation this morning. He says that she usually fixes her own breakfast which she didn't do. He  also says that she was standing against a wall in her room and then passed out as described.  Will admit to the hospital.  S/o to Dr. Doy Hutching.       ____________________________________________   FINAL CLINICAL IMPRESSION(S) / ED DIAGNOSES  Bronchitis.  Hypoxia.  Syncope.    NEW MEDICATIONS STARTED DURING THIS VISIT:  New Prescriptions   No medications on file     Note:  This document was prepared using Dragon voice recognition software and may include unintentional dictation errors.    Orbie Pyo, MD 02/12/16 1257

## 2016-02-12 NOTE — ED Triage Notes (Signed)
Patient arrives from home via [facility] with complaint of weakness. Son activated 911 due to patient "lethargy". Son states that mother is usually very active in her ADLS however, today has extreme lethargy. Patient is deaf and thus a poor historian. Patient noted to be 88% SPO2 on room air for ems and upon arrival.  Son arrives to ED and states that PT had a syncopal episode lasting 5 minutes.

## 2016-02-12 NOTE — ED Notes (Signed)
Per son, pt has been coughing. Son states he gave Childre[place]ns cough syrup x2 last night.

## 2016-02-13 ENCOUNTER — Telehealth: Payer: Self-pay | Admitting: Internal Medicine

## 2016-02-13 DIAGNOSIS — R0603 Acute respiratory distress: Secondary | ICD-10-CM | POA: Diagnosis not present

## 2016-02-13 DIAGNOSIS — J209 Acute bronchitis, unspecified: Secondary | ICD-10-CM | POA: Diagnosis not present

## 2016-02-13 DIAGNOSIS — I10 Essential (primary) hypertension: Secondary | ICD-10-CM | POA: Diagnosis not present

## 2016-02-13 DIAGNOSIS — R55 Syncope and collapse: Secondary | ICD-10-CM | POA: Diagnosis not present

## 2016-02-13 LAB — CBC
HEMATOCRIT: 38.4 % (ref 35.0–47.0)
HEMOGLOBIN: 12.9 g/dL (ref 12.0–16.0)
MCH: 29.6 pg (ref 26.0–34.0)
MCHC: 33.7 g/dL (ref 32.0–36.0)
MCV: 88 fL (ref 80.0–100.0)
Platelets: 149 10*3/uL — ABNORMAL LOW (ref 150–440)
RBC: 4.36 MIL/uL (ref 3.80–5.20)
RDW: 14.3 % (ref 11.5–14.5)
WBC: 3.5 10*3/uL — AB (ref 3.6–11.0)

## 2016-02-13 LAB — COMPREHENSIVE METABOLIC PANEL
ALBUMIN: 3.2 g/dL — AB (ref 3.5–5.0)
ALK PHOS: 56 U/L (ref 38–126)
ALT: 12 U/L — AB (ref 14–54)
AST: 31 U/L (ref 15–41)
Anion gap: 7 (ref 5–15)
BUN: 25 mg/dL — AB (ref 6–20)
CALCIUM: 8.9 mg/dL (ref 8.9–10.3)
CO2: 29 mmol/L (ref 22–32)
Chloride: 100 mmol/L — ABNORMAL LOW (ref 101–111)
Creatinine, Ser: 1.23 mg/dL — ABNORMAL HIGH (ref 0.44–1.00)
GFR calc Af Amer: 42 mL/min — ABNORMAL LOW (ref >60)
GFR calc non Af Amer: 36 mL/min — ABNORMAL LOW (ref >60)
Glucose, Bld: 123 mg/dL — ABNORMAL HIGH (ref 65–99)
Potassium: 4 mmol/L (ref 3.5–5.1)
SODIUM: 136 mmol/L (ref 135–145)
Total Bilirubin: 0.5 mg/dL (ref 0.3–1.2)
Total Protein: 8.1 g/dL (ref 6.5–8.1)

## 2016-02-13 MED ORDER — IPRATROPIUM-ALBUTEROL 0.5-2.5 (3) MG/3ML IN SOLN
3.0000 mL | RESPIRATORY_TRACT | Status: DC | PRN
Start: 2016-02-13 — End: 2016-02-13

## 2016-02-13 MED ORDER — AZITHROMYCIN 250 MG PO TABS: 250.0000 mg | ORAL_TABLET | Freq: Every day | ORAL | 0 refills | Status: DC

## 2016-02-13 MED ORDER — OSELTAMIVIR PHOSPHATE 30 MG PO CAPS
30.0000 mg | ORAL_CAPSULE | Freq: Every day | ORAL | 0 refills | Status: AC
Start: 2016-02-14 — End: 2016-02-17

## 2016-02-13 MED ORDER — PREDNISONE 20 MG PO TABS: 40.0000 mg | ORAL_TABLET | Freq: Every day | ORAL | 0 refills | Status: AC

## 2016-02-13 NOTE — Care Management Note (Signed)
Case Management Note  Patient Details  Name: Ellen Wright MRN: [id number] Date of Birth: 1918-03-25  Subjective/Objective:  Patient referred to [facility], PT and HHA. Walker ordered from [facility] and delivered. Daughter updated and is agreeable to POC.                  Action/Plan:   Expected Discharge Date:  02/13/16               Expected Discharge Plan:  [place]  In-House Referral:     Discharge planning Services  CM Consult  Post Acute Care Choice:  Durable Medical Equipment, Home Health Choice offered to:  Adult Children  DME Arranged:  Walker rolling DME Agency:  [place] Arranged:  RN, PT, Nurse's Aide [place] Agency:  [facility]  Status of Service:  Completed, signed off  If discussed at [place] of Stay Meetings, dates discussed:    Additional Comments:  Jolly Mango, RN 02/13/2016, 3:43 PM

## 2016-02-13 NOTE — Progress Notes (Signed)
Family Meeting Note  [facility] Directive:yes  Today a meeting took place with the daughter who is POA.     The following clinical team members were present during this meeting:MD  The following were discussed:Patient's diagnosis: Influenza A , Patient's progosis: Unable to determine and Goals for treatment: DNR  Additional follow-up to be provided: none  Time spent during discussion:16 minutes  Jaslyne Beeck, MD

## 2016-02-13 NOTE — Telephone Encounter (Signed)
Tiffany from [facility] called and needs to schedule a HFU - bronchitis hypoxia for 1 week with Dr. Nicki Reaper. Please advise, thank you!  Call pt with appt.

## 2016-02-13 NOTE — Evaluation (Signed)
Physical Therapy Evaluation Patient Details Name: Ellen Wright MRN: [id number] DOB: 09-17-1918 Today's Date: 02/13/2016   History of Present Illness  Pt is 83 yo female, came to the hospital w/ a worsening cough, maliaise and syncopal episode, diagnosed w/ acute respiratory distress. Has a history R breast cancer, HTN, and HLD    Clinical Impression  Pt awake alert w/ daughter present throughout PT eval. She is unable to respond to verbal commands due to hearing loss, but able to communicate through reading notes. Daughter reports no cognitive impairments at baseline. Pt overall strength appears WFLs for her age and she is able to move from supine to sitting w/ modified independence. Pt transferred to standing w/ RW under PT supervision. She displayed a narrow base of support in standing w/ slight crossing of LE's. Pt ambulated w/ RW under PT supervision for 80' and required cuing for stance position within the RW . Pt then ambulated w/ HHA and displayed signs of instability by reaching for the wall and nurses counter. Pt then ambulated w/ SPC and still displayed signs of instability and swaying under close PT supervision. Orthostatics was assessed and BP remained stable throughout postural changes. O2 sat stayed above 92% on room air throughout session. Recommended RW to patient and family member for additional stability. Overall patient is safe for household ambulation. She displays balance deficits and difficulty using DME, she will benefit from skilled PT to correct above deficits above. Recommend HHPT upon discharge from Acute hospitalization.     Follow Up Recommendations Home health PT    Equipment Recommendations  Rolling walker with 5" wheels    Recommendations for Other Services       Precautions / Restrictions Precautions Precautions: Fall Restrictions Weight Bearing Restrictions: No      Mobility  Bed Mobility Overal bed mobility: Modified Independent              General bed mobility comments: requires some use of bed rails to obtain sitting, tactile cuing due to loss of hearing   Transfers Overall transfer level: Needs assistance Equipment used: Rolling walker (2 wheeled) Transfers: Sit to/from Stand Sit to Stand: Supervision         General transfer comment: narrow BOS slight crossing of LEs, good push off w/ B UEs  Ambulation/Gait Ambulation/Gait assistance: Min guard Ambulation Distance (Feet): 200 Feet Assistive device: Rolling walker (2 wheeled);Straight cane Gait Pattern/deviations: Step-through pattern;Decreased stride length;Narrow base of support     General Gait Details: good stability w/ RW, cuing and education for standing within RW; displayed decrease gait speed and stability using cane, no LOB, reached for wall or counter when ambulating w/o AD  Stairs            Wheelchair Mobility    Modified Rankin (Stroke Patients Only)       Balance Overall balance assessment: Needs assistance Sitting-balance support: No upper extremity supported Sitting balance-Leahy Scale: Normal Sitting balance - Comments: Able to maintain sitting posture w/ cuing   Standing balance support: Bilateral upper extremity supported Standing balance-Leahy Scale: Good Standing balance comment: RW improves standing stability, LEs slightly crossed w/ narrow BOS                              Pertinent Vitals/Pain Pain Assessment: No/denies pain    Home Living Family/patient expects to be discharged to:: Private residence Living Arrangements: Children Available Help at Discharge: Family Type of Home: House Home Access:  Stairs to enter Entrance Stairs-Rails: Right Entrance Stairs-Number of Steps: 3 Home Layout: One level Home Equipment: Cane - single point      Prior Function Level of Independence: Independent with assistive device(s)         Comments: able to walk to mailbox and back, Independent in ADLs     Hand  Dominance        Extremity/Trunk Assessment   Upper Extremity Assessment Upper Extremity Assessment: Overall WFL for tasks assessed    Lower Extremity Assessment Lower Extremity Assessment: Overall WFL for tasks assessed       Communication   Communication: HOH (very HOH, family communicates through writing)  Cognition Arousal/Alertness: Awake/alert Behavior During Therapy: WFL for tasks assessed/performed Overall Cognitive Status: Within Functional Limits for tasks assessed                 General Comments: Daughter reports no cognitive impairments at baseline, able to follow written commands    General Comments      Exercises Other Exercises Other Exercises: gait training; RW for 80 ft, SPC for 60 ft, HHA for 60 ft w/ PT supervision throughout; to improve functional mobility; displayed increase stability w/ RW, cuing for safe use of RW    Assessment/Plan    PT Assessment Patient needs continued PT services  PT Problem List Decreased balance;Decreased mobility;Decreased knowledge of use of DME          PT Treatment Interventions DME instruction;Gait training;Stair training;Functional mobility training;Balance training;Therapeutic exercise;Therapeutic activities;Patient/family education    PT Goals (Current goals can be found in the Care Plan section)  Acute Rehab PT Goals Patient Stated Goal: Return home PT Goal Formulation: With patient/family Time For Goal Achievement: 02/27/16 Potential to Achieve Goals: Good    Frequency Min 2X/week   Barriers to discharge Inaccessible home environment      Co-evaluation               End of Session Equipment Utilized During Treatment: Gait belt Activity Tolerance: Patient tolerated treatment well Patient left: in chair;with chair alarm set;with call bell/phone within reach;with family/visitor present           Time: [id number] PT Time Calculation (min) (ACUTE ONLY): 33 min   Charges:         PT  G Codes:        [employer] Student PT 02/13/2016, 10:36 AM

## 2016-02-13 NOTE — Plan of Care (Signed)
Problem: Bowel/Gastric: Goal: Will not experience complications related to bowel motility Outcome: Completed/Met Date Met: 02/13/16 Pt has completed goals for discharge.

## 2016-02-13 NOTE — Progress Notes (Signed)
Called Dr. Bary Leriche to make follow-up appointment. Office will call patient with the appointment.

## 2016-02-13 NOTE — Progress Notes (Signed)
Shift assessment completed at 0800. Bed alarm rang, pt attempting to get oob, was assisted to commode and returned to bed. Pt is unable to hear anything spoken to her, hand gestures and writing used to communicate. Pt is on room air, lungs are clear bilat, HR is regular, abdomen is soft, bs heard. Urine is clear in appearance. Ppp, no edema noted. PIV #20 intact to r wrist area,site is free of redness and swelling. Since assessment completed, pt has had physical therapy evaluation, family member at bedside. Pt able ot swallow pills whole oen at a time, pt is cooperative with care. Pt is oob to chair at this time.

## 2016-02-13 NOTE — Progress Notes (Signed)
This writer has removed pt's piv from R arm with catheter intact, pt tolerated well. Daughter is given script for prednisone and is given d/c instructions for pt, walker has been delivered for home use. Pt is agreeable to discharge.

## 2016-02-13 NOTE — Care Management CC44 (Signed)
Condition Code 44 Documentation Completed  Patient Details  Name: Ellen Wright MRN: [id number] Date of Birth: 1918-01-31   Condition Code 44 given:    Patient signature on Condition Code 44 notice:    Documentation of 2 MD's agreement:    Code 44 added to claim:       Jolly Mango, RN 02/13/2016, 2:27 PM

## 2016-02-13 NOTE — Progress Notes (Signed)
PT is recommending home health. Clinical Social Worker (CSW) sent RN case manager a message making her aware of above. Please reconsult if future social work needs arise. CSW signing off.   Ridge Lafond, LCSW [phone]  

## 2016-02-13 NOTE — Discharge Summary (Signed)
[facility] NAME: Ellen Wright    MR#:  [id number]  DATE OF BIRTH:  Jun 30, 1918  DATE OF ADMISSION:  02/12/2016 ADMITTING PHYSICIAN: Idelle Crouch, MD  DATE OF DISCHARGE: 02/13/2016  PRIMARY CARE PHYSICIAN: Einar Pheasant, MD    ADMISSION DIAGNOSIS:  Bronchitis [J40] Hypoxia [R09.02] Syncope, unspecified syncope type [R55]  DISCHARGE DIAGNOSIS:  Principal Problem:   Acute respiratory distress Active Problems:   Essential hypertension, benign   Acute bronchitis   Syncope   SECONDARY DIAGNOSIS:   Past Medical History:  Diagnosis Date  . Breast cancer ([place]) 2006   right breast cancer  . Hearing loss   . Hyperlipidemia   . Hypertension   . Hyperthyroidism    s/p ablation    HOSPITAL COURSE:   83 year old female who came in with coughing and syncope.  1. Syncope: This is due to coughing.  2. Influenza a: Patient will complete treatment with Tamiflu for a total 5 days.  3. Acute bronchitis: Patient will continue on prednisone and azithromycin.  4. Essential hypertension: Patient will continue on Maxide and Altace  DISCHARGE CONDITIONS AND DIET:   Stable Regular diet  CONSULTS OBTAINED:    DRUG ALLERGIES:   Allergies  Allergen Reactions  . Penicillins     DISCHARGE MEDICATIONS:   Current Discharge Medication List    START taking these medications   Details  azithromycin (ZITHROMAX) 250 MG tablet Take 1 tablet (250 mg total) by mouth daily. Qty: 6 each, Refills: 0    oseltamivir (TAMIFLU) 30 MG capsule Take 1 capsule (30 mg total) by mouth daily. Qty: 3 capsule, Refills: 0    predniSONE (DELTASONE) 20 MG tablet Take 2 tablets (40 mg total) by mouth daily with breakfast. Qty: 6 tablet, Refills: 0      CONTINUE these medications which have NOT CHANGED   Details  latanoprost (XALATAN) 0.005 % ophthalmic solution Place 1 drop into both eyes at bedtime. Refills: 10    potassium chloride  (MICRO-K) 10 MEQ CR capsule TAKE 1 CAPSULE BY MOUTH  DAILY Qty: 90 capsule, Refills: 2    ramipril (ALTACE) 2.5 MG capsule TAKE 1 CAPSULE BY MOUTH  DAILY Qty: 90 capsule, Refills: 1    triamterene-hydrochlorothiazide (MAXZIDE-25) 37.5-25 MG tablet TAKE 1 TABLET BY MOUTH  DAILY Qty: 90 tablet, Refills: 1              Today   CHIEF COMPLAINT:  No acute issues overnight. Daughter is at bedside. Daughter reports the patient has improved. Patient herself is hard of hearing   VITAL SIGNS:  Blood pressure (!) 153/82, pulse 98, temperature 98.3 F (36.8 C), temperature source Oral, resp. rate 19, height 5' (1.524 m), weight 50.8 kg (112 lb), SpO2 96 %.   REVIEW OF SYSTEMS:  Review of Systems  Unable to perform ROS: Other  hard of hearing As per daughter patient doing well    PHYSICAL EXAMINATION:  GENERAL:  83 y.o.-year-old patient lying in the bed with no acute distress.  NECK:  Supple, no jugular venous distention. No thyroid enlargement, no tenderness.  LUNGS: Normal breath sounds bilaterally, no wheezing, rales,rhonchi  No use of accessory muscles of respiration.  CARDIOVASCULAR: S1, S2 normal. No murmurs, rubs, or gallops.  ABDOMEN: Soft, non-tender, non-distended. Bowel sounds present. No organomegaly or mass.  EXTREMITIES: No pedal edema, cyanosis, or clubbing.  PSYCHIATRIC: The patient is alert  SKIN: No obvious rash, lesion, or ulcer.   DATA REVIEW:  CBC  Recent Labs Lab 02/13/16 0358  WBC 3.5*  HGB 12.9  HCT 38.4  PLT 149*    Chemistries   Recent Labs Lab 02/13/16 0358  NA 136  K 4.0  CL 100*  CO2 29  GLUCOSE 123*  BUN 25*  CREATININE 1.23*  CALCIUM 8.9  AST 31  ALT 12*  ALKPHOS 56  BILITOT 0.5    Cardiac Enzymes  Recent Labs Lab 02/12/16 1110  TROPONINI <0.03    Microbiology Results  @MICRORSLT48 @  RADIOLOGY:  Dg Chest 2 View  Result Date: 02/12/2016 CLINICAL DATA:  Syncopal episode today.  Lethargy. EXAM: CHEST  2 VIEW  COMPARISON:  None. FINDINGS: Marked cardiac enlargement. Could not exclude a pericardial effusion. There is tortuosity and calcification of the thoracic aorta. No acute pulmonary findings. No pleural effusion. The bony thorax is intact. IMPRESSION: Enlargement of the cardiopericardial silhouette. No acute pulmonary findings. Electronically Signed   By: Marijo Sanes M.D.   On: 02/12/2016 11:36   Ct Head Wo Contrast  Result Date: 02/12/2016 CLINICAL DATA:  Syncope. EXAM: CT HEAD WITHOUT CONTRAST TECHNIQUE: Contiguous axial images were obtained from the base of the skull through the vertex without intravenous contrast. COMPARISON:  None. FINDINGS: Brain: Mild chronic ischemic white matter disease is noted. No midline shift is noted. Ventricular size is within normal limits. There is no evidence of hemorrhage or acute infarction 23 x 17 mm calcification is seen in the left cerebellopontine angle most consistent with calcified meningioma. Vascular: No hyperdense vessel or unexpected calcification. Skull: Normal. Negative for fracture or focal lesion. Sinuses/Orbits: No acute finding. Other: None. IMPRESSION: Mild chronic ischemic white matter disease. 23 x 17 mm calcifications seen in the left cerebellopontine angle most consistent with calcified meningioma. No other intracranial abnormality seen. Electronically Signed   By: Marijo Conception, M.D.   On: 02/12/2016 12:00      Management plans discussed with the patient's daughter and she is in agreement. Stable for discharge   Patient should follow up with pcp  CODE STATUS:     Code Status Orders        Start     Ordered   02/13/16 1245  Do not attempt resuscitation (DNR)  Continuous    Question Answer Comment  In the event of cardiac or respiratory ARREST Do not call a "code blue"   In the event of cardiac or respiratory ARREST Do not perform Intubation, CPR, defibrillation or ACLS   In the event of cardiac or respiratory ARREST Use medication by  any route, position, wound care, and other measures to relive pain and suffering. May use oxygen, suction and manual treatment of airway obstruction as needed for comfort.      02/13/16 1245    Code Status History    Date Active Date Inactive Code Status Order ID Comments User Context   02/12/2016  2:49 PM 02/13/2016 12:45 PM Full Code [id number]  Idelle Crouch, MD Inpatient    Advance Directive Documentation   Flowsheet Row Most Recent Value  Type of Advance Directive  Healthcare Power of Attorney  Pre-existing out of facility DNR order (yellow form or pink MOST form)  No data  "MOST" Form in Place?  No data      TOTAL TIME TAKING CARE OF THIS PATIENT: 38 minutes.    Note: This dictation was prepared with Dragon dictation along with smaller phrase technology. Any transcriptional errors that result from this process are unintentional.  Lazer Wollard M.D  on 02/13/2016 at 12:51 PM  Between 7am to 6pm - Pager - [phone] After 6pm go to [email or web address] - password [email or web address] [facility]  Office  [phone]  CC: Primary care physician; Einar Pheasant, MD

## 2016-02-14 ENCOUNTER — Telehealth: Payer: Self-pay

## 2016-02-14 NOTE — Telephone Encounter (Signed)
Discharged 02/13/16. Dx Bronchitis, Hypoxia Needs HFU with PCP Will follow with transitional care management as appropriate She has a follow up with you 03/28/15, is there an earlier place we can schedule her.

## 2016-02-14 NOTE — Telephone Encounter (Signed)
Unable to reach patient. Attempt to follow up with transitional care management.  Left a message to call me back.  Will follow as appropriate.

## 2016-02-15 ENCOUNTER — Telehealth: Payer: Self-pay

## 2016-02-15 NOTE — Telephone Encounter (Signed)
I can see her at 1:30 on 02/22/16.

## 2016-02-15 NOTE — Telephone Encounter (Signed)
Transition Care Management Follow-up Telephone Call   Date discharged? 02/13/16    How have you been since you were released from the hospital? She passed out yesterday, EMT gave clearance all ok. Resting well at home. No episodes today.  Coughing. No pain. No SOB.   Do you understand why you were in the hospital? YES, FLU, SYNCOPE and DIFFICULTY BREATHING.   Do you understand the discharge instructions? YES, increase activity as tolerated, follow up with PCP and take medications as directed.   Where were you discharged to? HOME.   Items Reviewed:  Medications reviewed: START TAMIFLU, ZITHROMAX AND PREDNISONE.  CONTINUE TAKING ALL OTHER SCHEDULED MEDICATIONS THAT HAVE NOT CHANGED.  Allergies reviewed: Pencillins  Dietary changes reviewed: Regular diet, no restrictions.  Referrals reviewed: YES, appointment scheduled WITH PCP.     Functional Questionnaire:   Activities of Daily Living (ADLs):   She states they are independent in the following: Meal prep, bathing, dressing, toileting, self feeding. States they require assistance with the following: Ambulates with walker/cane.  Home Health care in progress.   Any transportation issues/concerns?: NO issues at this time.   Any patient concerns? NO concerns at this time.   Confirmed importance and date/time of follow-up visits scheduled YES, appointment scheduled 02/22/16 at 1:30.  Provider Appointment booked with Dr. Nicki Reaper (PCP).  Confirmed with patient if condition begins to worsen call PCP or go to the ER.  Patient was given the office number and encouraged to call back with question or concerns.  : YES, verbalized understanding.

## 2016-02-16 ENCOUNTER — Ambulatory Visit
Admission: RE | Admit: 2016-02-16 | Discharge: 2016-02-16 | Disposition: A | Discharge status: Home / Self Care | Payer: Medicare Other | Source: Ambulatory Visit | Attending: Oncology | Admitting: Oncology

## 2016-02-16 DIAGNOSIS — R928 Other abnormal and inconclusive findings on diagnostic imaging of breast: Secondary | ICD-10-CM

## 2016-02-16 DIAGNOSIS — N6310 Unspecified lump in the right breast, unspecified quadrant: Secondary | ICD-10-CM

## 2016-02-16 DIAGNOSIS — C50911 Malignant neoplasm of unspecified site of right female breast: Secondary | ICD-10-CM | POA: Insufficient documentation

## 2016-02-16 DIAGNOSIS — C50811 Malignant neoplasm of overlapping sites of right female breast: Secondary | ICD-10-CM | POA: Diagnosis not present

## 2016-02-16 DIAGNOSIS — N6489 Other specified disorders of breast: Secondary | ICD-10-CM | POA: Diagnosis not present

## 2016-02-16 NOTE — Telephone Encounter (Signed)
Completed by Dorthula Rue

## 2016-02-17 ENCOUNTER — Telehealth: Payer: Self-pay | Admitting: Internal Medicine

## 2016-02-17 DIAGNOSIS — J209 Acute bronchitis, unspecified: Secondary | ICD-10-CM | POA: Diagnosis not present

## 2016-02-17 DIAGNOSIS — M6281 Muscle weakness (generalized): Secondary | ICD-10-CM | POA: Diagnosis not present

## 2016-02-17 DIAGNOSIS — Z9181 History of falling: Secondary | ICD-10-CM | POA: Diagnosis not present

## 2016-02-17 DIAGNOSIS — I10 Essential (primary) hypertension: Secondary | ICD-10-CM | POA: Diagnosis not present

## 2016-02-17 DIAGNOSIS — E785 Hyperlipidemia, unspecified: Secondary | ICD-10-CM | POA: Diagnosis not present

## 2016-02-17 DIAGNOSIS — H409 Unspecified glaucoma: Secondary | ICD-10-CM | POA: Diagnosis not present

## 2016-02-17 DIAGNOSIS — E039 Hypothyroidism, unspecified: Secondary | ICD-10-CM | POA: Diagnosis not present

## 2016-02-17 DIAGNOSIS — Z853 Personal history of malignant neoplasm of breast: Secondary | ICD-10-CM | POA: Diagnosis not present

## 2016-02-17 DIAGNOSIS — Z792 Long term (current) use of antibiotics: Secondary | ICD-10-CM | POA: Diagnosis not present

## 2016-02-17 DIAGNOSIS — Z7952 Long term (current) use of systemic steroids: Secondary | ICD-10-CM | POA: Diagnosis not present

## 2016-02-17 NOTE — Telephone Encounter (Signed)
[place] [phone] called from [facility] regarding needing skilled nursing orders for assessment and medication management. Pt had a fall on 02/14/2016. Pt is having left side rib pain. Pt also had a medication interaction on potassium chloride (MICRO-K) 10 MEQ CR capsule and triamterene-hydrochlorothiazide (MAXZIDE-25) 37.5-25 MG tablet. It shows level 2. Verbal order and vm can be left. Thank you!

## 2016-02-17 NOTE — Telephone Encounter (Signed)
L/m to call office gave orders on v/m and asked them to call back to answer other questions.

## 2016-02-17 NOTE — Telephone Encounter (Signed)
pls advise

## 2016-02-17 NOTE — Telephone Encounter (Signed)
Ok orders for skilled nursing evaluation.  Regarding the drug interaction - her potassium is monitored.  Potassium just checked 02/14/16 and wnl.  Regarding fall and rib pain, is she sob or any other symptoms.  Did she hit head.  If any acute issues, she needs to be evaluated.

## 2016-02-18 DIAGNOSIS — Z853 Personal history of malignant neoplasm of breast: Secondary | ICD-10-CM | POA: Diagnosis not present

## 2016-02-18 DIAGNOSIS — E039 Hypothyroidism, unspecified: Secondary | ICD-10-CM | POA: Diagnosis not present

## 2016-02-18 DIAGNOSIS — Z792 Long term (current) use of antibiotics: Secondary | ICD-10-CM | POA: Diagnosis not present

## 2016-02-18 DIAGNOSIS — Z9181 History of falling: Secondary | ICD-10-CM | POA: Diagnosis not present

## 2016-02-18 DIAGNOSIS — J209 Acute bronchitis, unspecified: Secondary | ICD-10-CM | POA: Diagnosis not present

## 2016-02-18 DIAGNOSIS — H409 Unspecified glaucoma: Secondary | ICD-10-CM | POA: Diagnosis not present

## 2016-02-18 DIAGNOSIS — M6281 Muscle weakness (generalized): Secondary | ICD-10-CM | POA: Diagnosis not present

## 2016-02-18 DIAGNOSIS — I10 Essential (primary) hypertension: Secondary | ICD-10-CM | POA: Diagnosis not present

## 2016-02-18 DIAGNOSIS — Z7952 Long term (current) use of systemic steroids: Secondary | ICD-10-CM | POA: Diagnosis not present

## 2016-02-18 DIAGNOSIS — E785 Hyperlipidemia, unspecified: Secondary | ICD-10-CM | POA: Diagnosis not present

## 2016-02-21 ENCOUNTER — Telehealth: Payer: Self-pay | Admitting: *Deleted

## 2016-02-21 DIAGNOSIS — Z792 Long term (current) use of antibiotics: Secondary | ICD-10-CM | POA: Diagnosis not present

## 2016-02-21 DIAGNOSIS — Z853 Personal history of malignant neoplasm of breast: Secondary | ICD-10-CM | POA: Diagnosis not present

## 2016-02-21 DIAGNOSIS — Z9181 History of falling: Secondary | ICD-10-CM | POA: Diagnosis not present

## 2016-02-21 DIAGNOSIS — E785 Hyperlipidemia, unspecified: Secondary | ICD-10-CM | POA: Diagnosis not present

## 2016-02-21 DIAGNOSIS — M6281 Muscle weakness (generalized): Secondary | ICD-10-CM | POA: Diagnosis not present

## 2016-02-21 DIAGNOSIS — Z7952 Long term (current) use of systemic steroids: Secondary | ICD-10-CM | POA: Diagnosis not present

## 2016-02-21 DIAGNOSIS — J209 Acute bronchitis, unspecified: Secondary | ICD-10-CM | POA: Diagnosis not present

## 2016-02-21 DIAGNOSIS — I10 Essential (primary) hypertension: Secondary | ICD-10-CM | POA: Diagnosis not present

## 2016-02-21 DIAGNOSIS — E039 Hypothyroidism, unspecified: Secondary | ICD-10-CM | POA: Diagnosis not present

## 2016-02-21 DIAGNOSIS — H409 Unspecified glaucoma: Secondary | ICD-10-CM | POA: Diagnosis not present

## 2016-02-21 NOTE — Telephone Encounter (Signed)
  Oncology Nurse Navigator Documentation  Navigator Location: [facility]Med [facility]c (02/21/16 1400) Referral date to RadOnc/MedOnc: 02/24/16 (02/21/16 1400) )Navigator Encounter Type: Introductory phone call (02/21/16 1400)   Abnormal Finding Date: 02/02/16 (02/21/16 1400) Confirmed Diagnosis Date: 02/17/16 (02/21/16 1400)                       Interventions: Coordination of Care (02/21/16 1400)    Discussed case with Dr. Grayland Ormond.  He requested I call the patients daughter and set up appointment for the patient return to the clinic for post biopsy treatment planning unless the family was adamant they wanted no further treatment.  Talked with patient's daughter Vaughan Basta, and she does want to discuss antihormonal therapy.  Patient scheduled for Friday, February 16 @ 9:45.                  Time Spent with Patient: 15 (02/21/16 1400)

## 2016-02-22 ENCOUNTER — Ambulatory Visit (INDEPENDENT_AMBULATORY_CARE_PROVIDER_SITE_OTHER): Payer: Medicare Other | Admitting: Internal Medicine

## 2016-02-22 ENCOUNTER — Encounter: Payer: Self-pay | Admitting: Internal Medicine

## 2016-02-22 DIAGNOSIS — J209 Acute bronchitis, unspecified: Secondary | ICD-10-CM | POA: Diagnosis not present

## 2016-02-22 DIAGNOSIS — C50411 Malignant neoplasm of upper-outer quadrant of right female breast: Secondary | ICD-10-CM

## 2016-02-22 DIAGNOSIS — R55 Syncope and collapse: Secondary | ICD-10-CM

## 2016-02-22 DIAGNOSIS — I10 Essential (primary) hypertension: Secondary | ICD-10-CM

## 2016-02-22 DIAGNOSIS — R634 Abnormal weight loss: Secondary | ICD-10-CM

## 2016-02-22 DIAGNOSIS — W19XXXA Unspecified fall, initial encounter: Secondary | ICD-10-CM

## 2016-02-22 NOTE — Progress Notes (Signed)
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  INTERVAL HISTORY: Patient returns to clinic today for discussion of her biopsy results and treatment planning. Patient has significant dementia and the entire history is given by her daughter. Patient has offered no complaints. She has no new neurologic complaints. There is no report of any fevers or illnesses. She has no chest pain or shortness of breath. She has a fair appetite, but there there is no report of nausea, vomiting, constipation, or diarrhea. She has no urinary complaints. Patient otherwise feels well and offers no further specific complaints.  REVIEW OF SYSTEMS:   Review of Systems  Unable to perform ROS: Dementia    As per HPI. Otherwise, a complete review of systems is negative.  PAST MEDICAL HISTORY: Past Medical History:  Diagnosis Date  . Breast cancer ([place]) 2006   right breast cancer  . Hearing loss   . Hyperlipidemia   . Hypertension   . Hyperthyroidism    s/p ablation    PAST SURGICAL HISTORY: Past Surgical History:  Procedure Laterality Date  . BREAST EXCISIONAL BIOPSY Right 2007   positive    FAMILY HISTORY: Family History  Problem Relation Age of Onset  . Lung disease Brother     ADVANCED DIRECTIVES (Y/N):  N  HEALTH MAINTENANCE: Social History  Substance Use Topics  . Smoking status: Never Smoker  . Smokeless tobacco: Current User    Types: Snuff  . Alcohol use No     Colonoscopy:  PAP:  Bone density:  Lipid panel:  Allergies  Allergen Reactions  . Penicillins     Current Outpatient Prescriptions  Medication Sig Dispense Refill  . latanoprost (XALATAN) 0.005 % ophthalmic  solution Place 1 drop into both eyes at bedtime.  10  . potassium chloride (MICRO-K) 10 MEQ CR capsule TAKE 1 CAPSULE BY MOUTH  DAILY 90 capsule 2  . ramipril (ALTACE) 2.5 MG capsule TAKE 1 CAPSULE BY MOUTH  DAILY 90 capsule 1  . triamterene-hydrochlorothiazide (MAXZIDE-25) 37.5-25 MG tablet TAKE 1 TABLET BY MOUTH  DAILY 90 tablet 1  . letrozole (FEMARA) 2.5 MG tablet Take 1 tablet (2.5 mg total) by mouth daily. 90 tablet 3   No current facility-administered medications for this visit.     OBJECTIVE: Vitals:   02/24/16 1001  BP: (!) 88/59  Pulse: 90  Resp: 16  Temp: (!) 96.9 F (36.1 C)     Body mass index is 19.07 kg/m.    ECOG FS:2 - Symptomatic, <50% confined to bed  General: Well-developed, well-nourished, no acute distress. Eyes: Pink conjunctiva, anicteric sclera. Breasts: Easily palpable 1-2 cm breast mass and upper outer quadrant. Multiple axillary lymph nodes are also palpable. Lungs: Clear to auscultation bilaterally. Heart: Regular rate and rhythm. No rubs, murmurs, or gallops. Abdomen: Soft, nontender, nondistended. No organomegaly noted, normoactive bowel sounds. Musculoskeletal: No edema, cyanosis, or clubbing. Neuro: Alert, answering all questions appropriately. Cranial nerves grossly intact. Skin: No rashes or petechiae noted. Psych: Normal affect. Lymphatics: No cervical, calvicular, axillary or inguinal LAD.   LAB RESULTS:  Lab Results  Component Value Date   NA 136 02/13/2016   K 4.0 02/13/2016   CL 100 (L) 02/13/2016   CO2 29 02/13/2016  GLUCOSE 123 (H) 02/13/2016   BUN 25 (H) 02/13/2016   CREATININE 1.23 (H) 02/13/2016   CALCIUM 8.9 02/13/2016   PROT 8.1 02/13/2016   ALBUMIN 3.2 (L) 02/13/2016   AST 31 02/13/2016   ALT 12 (L) 02/13/2016   ALKPHOS 56 02/13/2016   BILITOT 0.5 02/13/2016   GFRNONAA 36 (L) 02/13/2016   GFRAA 42 (L) 02/13/2016    Lab Results  Component Value Date   WBC 3.5 (L) 02/13/2016   NEUTROABS 3.4 02/12/2016   HGB 12.9  02/13/2016   HCT 38.4 02/13/2016   MCV 88.0 02/13/2016   PLT 149 (L) 02/13/2016   Lab Results  Component Value Date   LABCA2 17.9 12/09/2015     STUDIES: Dg Chest 2 View  Result Date: 02/12/2016 CLINICAL DATA:  Syncopal episode today.  Lethargy. EXAM: CHEST  2 VIEW COMPARISON:  None. FINDINGS: Marked cardiac enlargement. Could not exclude a pericardial effusion. There is tortuosity and calcification of the thoracic aorta. No acute pulmonary findings. No pleural effusion. The bony thorax is intact. IMPRESSION: Enlargement of the cardiopericardial silhouette. No acute pulmonary findings. Electronically Signed   By: Marijo Sanes M.D.   On: 02/12/2016 11:36   Ct Head Wo Contrast  Result Date: 02/12/2016 CLINICAL DATA:  Syncope. EXAM: CT HEAD WITHOUT CONTRAST TECHNIQUE: Contiguous axial images were obtained from the base of the skull through the vertex without intravenous contrast. COMPARISON:  None. FINDINGS: Brain: Mild chronic ischemic white matter disease is noted. No midline shift is noted. Ventricular size is within normal limits. There is no evidence of hemorrhage or acute infarction 23 x 17 mm calcification is seen in the left cerebellopontine angle most consistent with calcified meningioma. Vascular: No hyperdense vessel or unexpected calcification. Skull: Normal. Negative for fracture or focal lesion. Sinuses/Orbits: No acute finding. Other: None. IMPRESSION: Mild chronic ischemic white matter disease. 23 x 17 mm calcifications seen in the left cerebellopontine angle most consistent with calcified meningioma. No other intracranial abnormality seen. Electronically Signed   By: Marijo Conception, M.D.   On: 02/12/2016 12:00   US Breast Ltd Uni Right Inc Axilla  Result Date: 02/02/2016 CLINICAL DATA:  83 year old female with palpable masses in the right breast and axilla. The patient is status post right lumpectomy in 2007. She did not receive radiation or chemotherapy but did complete 5 years  of Femara. Per Dr. Gary Fleet note, the patient and daughter have stated that they would not wish any further surgery or radiation for this probable recurrence but the patient would want to reinitiate Femara if the cancer were shown to be ER/PR positive. EXAM: 2D DIGITAL DIAGNOSTIC BILATERAL MAMMOGRAM WITH CAD AND ADJUNCT TOMO ULTRASOUND RIGHT BREAST COMPARISON:  Previous exam(s). ACR Breast Density Category c: The breast tissue is heterogeneously dense, which may obscure small masses. FINDINGS: There is a 3.4 x 3.3 x 3.7 cm irregular hyper dense mass in the upper, outer right breast. There are associated coarse heterogeneous calcifications which span at least 2 cm. The right breast is shrunken with diffuse right breast skin thickening. There is no suspicious abnormality in the left breast. Mammographic images were processed with CAD. On physical exam, there is a large palpable mass centered within the right breast at 9:30, 3 cm from the nipple, corresponding to the dominant mass seen mammographically. There is an additional smaller palpable nodule at 10 o'clock, 6 cm from the nipple and 2 adjacent palpable nodules within the right axilla. Targeted ultrasound is performed, showing a dominant mass at  9:30, 3 cm from the nipple measuring at least 3.8 x 2.5 x 3.0 cm. Multiple additional smaller masses are seen within the upper, outer and upper, inner right breast consistent with satellite lesions. Multiple small satellite lesions are noted within the pectoralis musculature. Two markedly enlarged axillary lymph nodes are seen within the right axilla measuring 2.4 and 2.3 cm in maximal size. IMPRESSION: Highly suspicious dominant right breast mass, satellite lesions, and axillary adenopathy. RECOMMENDATION: Ultrasound-guided biopsy of the highly suspicious right breast mass. Further biopsies could be performed if clinically warranted. I have discussed the findings and recommendations with the patient and her daughter.  Results were also provided in writing at the conclusion of the visit. If applicable, a reminder letter will be sent to the patient regarding the next appointment. BI-RADS CATEGORY  5: Highly suggestive of malignancy. Electronically Signed   By: Pamelia Hoit M.D.   On: 02/02/2016 15:29   Mm Diag Breast Tomo Bilateral  Result Date: 02/02/2016 CLINICAL DATA:  83 year old female with palpable masses in the right breast and axilla. The patient is status post right lumpectomy in 2007. She did not receive radiation or chemotherapy but did complete 5 years of Femara. Per Dr. Gary Fleet note, the patient and daughter have stated that they would not wish any further surgery or radiation for this probable recurrence but the patient would want to reinitiate Femara if the cancer were shown to be ER/PR positive. EXAM: 2D DIGITAL DIAGNOSTIC BILATERAL MAMMOGRAM WITH CAD AND ADJUNCT TOMO ULTRASOUND RIGHT BREAST COMPARISON:  Previous exam(s). ACR Breast Density Category c: The breast tissue is heterogeneously dense, which may obscure small masses. FINDINGS: There is a 3.4 x 3.3 x 3.7 cm irregular hyper dense mass in the upper, outer right breast. There are associated coarse heterogeneous calcifications which span at least 2 cm. The right breast is shrunken with diffuse right breast skin thickening. There is no suspicious abnormality in the left breast. Mammographic images were processed with CAD. On physical exam, there is a large palpable mass centered within the right breast at 9:30, 3 cm from the nipple, corresponding to the dominant mass seen mammographically. There is an additional smaller palpable nodule at 10 o'clock, 6 cm from the nipple and 2 adjacent palpable nodules within the right axilla. Targeted ultrasound is performed, showing a dominant mass at 9:30, 3 cm from the nipple measuring at least 3.8 x 2.5 x 3.0 cm. Multiple additional smaller masses are seen within the upper, outer and upper, inner right breast  consistent with satellite lesions. Multiple small satellite lesions are noted within the pectoralis musculature. Two markedly enlarged axillary lymph nodes are seen within the right axilla measuring 2.4 and 2.3 cm in maximal size. IMPRESSION: Highly suspicious dominant right breast mass, satellite lesions, and axillary adenopathy. RECOMMENDATION: Ultrasound-guided biopsy of the highly suspicious right breast mass. Further biopsies could be performed if clinically warranted. I have discussed the findings and recommendations with the patient and her daughter. Results were also provided in writing at the conclusion of the visit. If applicable, a reminder letter will be sent to the patient regarding the next appointment. BI-RADS CATEGORY  5: Highly suggestive of malignancy. Electronically Signed   By: Pamelia Hoit M.D.   On: 02/02/2016 15:29   [place] Rt Breast Bx W Loc Dev 1st Lesion Img Bx Spec US Guide  Addendum Date: 02/17/2016   ADDENDUM REPORT: 02/17/2016 16:59 ADDENDUM: Pathology results of the patient's right breast biopsy show invasive mammary carcinoma, no special type. Pathology results  are concordant with imaging findings. I telephoned the pathology results to the patient's daughter, Vaughan Basta, as requested. She reports that her mother is doing well after the biopsy and has no concerns regarding the biopsy site. Our staff will inform Dr. Gary Fleet office of the biopsy results, and it is planned that the patient will follow-up with him in the clinic. Electronically Signed   By: Curlene Dolphin M.D.   On: 02/17/2016 16:59   Result Date: 02/17/2016 CLINICAL DATA:  83 year old female with highly suspicious right breast mass EXAM: ULTRASOUND GUIDED RIGHT BREAST CORE NEEDLE BIOPSY COMPARISON:  Previous exam(s). FINDINGS: I met with the patient and we discussed the procedure of ultrasound-guided biopsy, including benefits and alternatives. We discussed the high likelihood of a successful procedure. We discussed the risks  of the procedure, including infection, bleeding, tissue injury, clip migration, and inadequate sampling. Informed written consent was given. The usual time-out protocol was performed immediately prior to the procedure. Using sterile technique and 1% Lidocaine as local anesthetic, under direct ultrasound visualization, a 14 gauge spring-loaded device was used to perform biopsy of a highly suspicious mass within the lateral right breast using a medial to lateral approach. At the conclusion of the procedure a HydroMARK shape 4 tissue marker clip was deployed into the biopsy cavity. Clip deployment was visualized under ultrasound guidance. A post biopsy mammogram was not performed. IMPRESSION: Ultrasound guided biopsy of a highly suspicious mass within the lateral right breast. No apparent complications. Electronically Signed: By: Pamelia Hoit M.D. 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  PLAN:    1. 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 Patient's original diagnosis in 2007 was clinical stage IA ER/PR positive, HER-2 negative adenocarcinoma of the right breast. She underwent lumpectomy, but declined adjuvant XRT. She completed 5 years of Femara in May 2012. Patient and daughter declined any further surgery, radiation, or XRT. Patient was given a prescription for letrozole again today. Return to clinic in 3 months for further evaluation.   Approximately 20 minutes was spent in discussion of which greater than 50% was consultation.  Patient expressed understanding and was in agreement with this plan. She also understands that She can call clinic at any time with any questions, concerns, or complaints.   Cancer Staging Primary cancer of upper outer quadrant of right female breast [facility]) Staging form:  Breast, AJCC 8th Edition - Clinical stage from 02/22/2016: Stage IIA (cT2, cN1, cM0, G2, ER: Positive, PR: Positive, HER2: Equivocal) - Signed by Lloyd Huger, MD on 02/22/2016   Lloyd Huger, MD   02/26/2016 8:37 AM

## 2016-02-22 NOTE — Progress Notes (Signed)
Patient ID: Maryalice Pasley, female   DOB: 1918-03-18, 83 y.o.   MRN: [id number]   Subjective:    Patient ID: Daysi Boggan, female    DOB: [birth year], 83 y.o.   MRN: [id number]  HPI  Patient here for hospital follow up. She was admitted 02/12/16 and diagnosed with bronchitis, syncope and hypoxia.  She was diagnosed with influenza.  Treated with tamiflu.  Also diagnosed with bronchitis and treated with prednisone and azithromycin.  It was felt the syncope was related to increased cough.  She is accompanied by her daughter.  History obtained from both of them.  Reports her breathing is better.  States the day after she returned home from the hospital, she fell back and hit her head.  Also aggravated her back.  They have been using a heating pad and she has been taking tylenol.  Moving better now.  Decreased pain.   They have noticed since being home, she has been sleeping more.  Not as talkative.  Daughter states the last 48 hours, this has improved. More alert now.  No chest pain.  No increased cough.  Denies any significant pain.  No nausea or vomiting.  No diarrhea.     Past Medical History:  Diagnosis Date  . Breast cancer ([place]) 2006   right breast cancer  . Hearing loss   . Hyperlipidemia   . Hypertension   . Hyperthyroidism    s/p ablation   Past Surgical History:  Procedure Laterality Date  . BREAST EXCISIONAL BIOPSY Right 2007   positive   Family History  Problem Relation Age of Onset  . Lung disease Brother    Social History   Social History  . Marital status: Widowed    Spouse name: N/A  . Number of children: N/A  . Years of education: N/A   Social History Main Topics  . Smoking status: Never Smoker  . Smokeless tobacco: Current User    Types: Snuff  . Alcohol use No  . Drug use: No  . Sexual activity: Not Asked   Other Topics Concern  . None   Social History Narrative  . None    Outpatient Encounter Prescriptions as of 02/22/2016  Medication Sig  . latanoprost  (XALATAN) 0.005 % ophthalmic solution Place 1 drop into both eyes at bedtime.  . potassium chloride (MICRO-K) 10 MEQ CR capsule TAKE 1 CAPSULE BY MOUTH  DAILY  . ramipril (ALTACE) 2.5 MG capsule TAKE 1 CAPSULE BY MOUTH  DAILY  . triamterene-hydrochlorothiazide (MAXZIDE-25) 37.5-25 MG tablet TAKE 1 TABLET BY MOUTH  DAILY  . [DISCONTINUED] azithromycin (ZITHROMAX) 250 MG tablet Take 1 tablet (250 mg total) by mouth daily.   No facility-administered encounter medications on file as of 02/22/2016.     Review of Systems  Constitutional:       She has not been eating as much.  Appetite increasing some now.  More awake today and yesterday.    HENT: Negative for congestion and sinus pressure.   Respiratory: Negative for cough, chest tightness and shortness of breath.   Cardiovascular: Negative for chest pain, palpitations and leg swelling.  Gastrointestinal: Negative for abdominal pain, diarrhea, nausea and vomiting.  Genitourinary: Negative for difficulty urinating and dysuria.  Musculoskeletal:       Some side pain as outlined.  Better.    Skin: Negative for color change and rash.  Neurological: Negative for dizziness, light-headedness and headaches.  Psychiatric/Behavioral: Negative for agitation and dysphoric mood.       Objective:  Physical Exam  Constitutional: No distress.  Answering questions when asked.    HENT:  Nose: Nose normal.  Mouth/Throat: Oropharynx is clear and moist.  Neck: Neck supple. No thyromegaly present.  Cardiovascular: Normal rate and regular rhythm.   Pulmonary/Chest: Breath sounds normal. No respiratory distress. She has no wheezes.  Abdominal: Soft. Bowel sounds are normal. There is no tenderness.  Musculoskeletal: She exhibits no edema or tenderness.  Lymphadenopathy:    She has no cervical adenopathy.  Skin: No rash noted. No erythema.  Psychiatric: She has a normal mood and affect. Her behavior is normal.    BP 120/70 (BP Location: Left Arm,  Patient Position: Sitting, Cuff Size: Large)   Pulse 100   Temp 97.6 F (36.4 C) (Oral)   Resp 18   Ht 5' (1.524 m)   Wt 97 lb 12.8 oz (44.4 kg)   SpO2 98%   BMI 19.10 kg/m  Wt Readings from Last 3 Encounters:  02/24/16 97 lb 10.6 oz (44.3 kg)  02/22/16 97 lb 12.8 oz (44.4 kg)  02/13/16 112 lb (50.8 kg)     Lab Results  Component Value Date   WBC 3.5 (L) 02/13/2016   HGB 12.9 02/13/2016   HCT 38.4 02/13/2016   PLT 149 (L) 02/13/2016   GLUCOSE 123 (H) 02/13/2016   CHOL 148 03/27/2012   TRIG 59.0 03/27/2012   HDL 58.30 03/27/2012   LDLCALC 78 03/27/2012   ALT 12 (L) 02/13/2016   AST 31 02/13/2016   NA 136 02/13/2016   K 4.0 02/13/2016   CL 100 (L) 02/13/2016   CREATININE 1.23 (H) 02/13/2016   BUN 25 (H) 02/13/2016   CO2 29 02/13/2016   TSH 1.553 02/12/2016    [place] Rt Breast Bx W Loc Dev 1st Lesion Img Bx Spec US Guide  Addendum Date: 02/17/2016   ADDENDUM REPORT: 02/17/2016 16:59 ADDENDUM: Pathology results of the patient's right breast biopsy show invasive mammary carcinoma, no special type. Pathology results are concordant with imaging findings. I telephoned the pathology results to the patient's daughter, Vaughan Basta, as requested. She reports that her mother is doing well after the biopsy and has no concerns regarding the biopsy site. Our staff will inform Dr. Gary Fleet office of the biopsy results, and it is planned that the patient will follow-up with him in the clinic. Electronically Signed   By: Curlene Dolphin M.D.   On: 02/17/2016 16:59   Result Date: 02/17/2016 CLINICAL DATA:  83 year old female with highly suspicious right breast mass EXAM: ULTRASOUND GUIDED RIGHT BREAST CORE NEEDLE BIOPSY COMPARISON:  Previous exam(s). FINDINGS: I met with the patient and we discussed the procedure of ultrasound-guided biopsy, including benefits and alternatives. We discussed the high likelihood of a successful procedure. We discussed the risks of the procedure, including infection,  bleeding, tissue injury, clip migration, and inadequate sampling. Informed written consent was given. The usual time-out protocol was performed immediately prior to the procedure. Using sterile technique and 1% Lidocaine as local anesthetic, under direct ultrasound visualization, a 14 gauge spring-loaded device was used to perform biopsy of a highly suspicious mass within the lateral right breast using a medial to lateral approach. At the conclusion of the procedure a HydroMARK shape 4 tissue marker clip was deployed into the biopsy cavity. Clip deployment was visualized under ultrasound guidance. A post biopsy mammogram was not performed. IMPRESSION: Ultrasound guided biopsy of a highly suspicious mass within the lateral right breast. No apparent complications. Electronically Signed: By: Pamelia Hoit M.D. On:  02/16/2016 09:52       Assessment & Plan:   Problem List Items Addressed This Visit    Acute bronchitis    Doing better s/p treatment.  Breathing better.  Follow.        Essential hypertension, benign    Blood pressure under good control.  Continue same medication regimen.  Follow pressures.  Follow metabolic panel.        Fall    Had the fall as outlined.  Did hit her head.  Has been sleeping more since home from hospital.  Discussed with her daughter today.  This could be related to the infection, etc.  May also be related to hitting her head.  Discussed obtaining a CT head to confirm no acute change, bleed, etc.  She declines at this time. States she is doing better now.  Has been over one week since the fall.  Will follow.  Evaluate if any symptom change or problems.        Loss of weight    Has had weight loss.  Encourage increased po intake.  Follow.        Primary cancer of upper outer quadrant of right female breast ([place])    Had biopsy of mass.  Positive.  Has f/u planned with oncology to discuss treatment.        Syncope    Felt to be related to increased cough and being  sick.  Worked up in the hospital.  Treated for infection.  Follow.            Einar Pheasant, MD

## 2016-02-22 NOTE — Progress Notes (Signed)
Pre-visit discussion using our clinic review tool. No additional management support is needed unless otherwise documented below in the visit note.  

## 2016-02-23 DIAGNOSIS — Z792 Long term (current) use of antibiotics: Secondary | ICD-10-CM | POA: Diagnosis not present

## 2016-02-23 DIAGNOSIS — J209 Acute bronchitis, unspecified: Secondary | ICD-10-CM | POA: Diagnosis not present

## 2016-02-23 DIAGNOSIS — Z853 Personal history of malignant neoplasm of breast: Secondary | ICD-10-CM | POA: Diagnosis not present

## 2016-02-23 DIAGNOSIS — E039 Hypothyroidism, unspecified: Secondary | ICD-10-CM | POA: Diagnosis not present

## 2016-02-23 DIAGNOSIS — Z9181 History of falling: Secondary | ICD-10-CM | POA: Diagnosis not present

## 2016-02-23 DIAGNOSIS — M6281 Muscle weakness (generalized): Secondary | ICD-10-CM | POA: Diagnosis not present

## 2016-02-23 DIAGNOSIS — I10 Essential (primary) hypertension: Secondary | ICD-10-CM | POA: Diagnosis not present

## 2016-02-23 DIAGNOSIS — Z7952 Long term (current) use of systemic steroids: Secondary | ICD-10-CM | POA: Diagnosis not present

## 2016-02-23 DIAGNOSIS — E785 Hyperlipidemia, unspecified: Secondary | ICD-10-CM | POA: Diagnosis not present

## 2016-02-23 DIAGNOSIS — H409 Unspecified glaucoma: Secondary | ICD-10-CM | POA: Diagnosis not present

## 2016-02-24 ENCOUNTER — Encounter: Payer: Self-pay | Admitting: Diagnostic Radiology

## 2016-02-24 ENCOUNTER — Inpatient Hospital Stay: Payer: Medicare Other | Attending: Oncology | Admitting: Oncology

## 2016-02-24 DIAGNOSIS — M6281 Muscle weakness (generalized): Secondary | ICD-10-CM | POA: Diagnosis not present

## 2016-02-24 DIAGNOSIS — C50411 Malignant neoplasm of upper-outer quadrant of right female breast: Secondary | ICD-10-CM | POA: Insufficient documentation

## 2016-02-24 DIAGNOSIS — R5383 Other fatigue: Secondary | ICD-10-CM | POA: Insufficient documentation

## 2016-02-24 DIAGNOSIS — E039 Hypothyroidism, unspecified: Secondary | ICD-10-CM | POA: Diagnosis not present

## 2016-02-24 DIAGNOSIS — Z79899 Other long term (current) drug therapy: Secondary | ICD-10-CM | POA: Insufficient documentation

## 2016-02-24 DIAGNOSIS — R55 Syncope and collapse: Secondary | ICD-10-CM | POA: Diagnosis not present

## 2016-02-24 DIAGNOSIS — E0590 Thyrotoxicosis, unspecified without thyrotoxic crisis or storm: Secondary | ICD-10-CM

## 2016-02-24 DIAGNOSIS — Z79811 Long term (current) use of aromatase inhibitors: Secondary | ICD-10-CM | POA: Diagnosis not present

## 2016-02-24 DIAGNOSIS — Z9181 History of falling: Secondary | ICD-10-CM | POA: Diagnosis not present

## 2016-02-24 DIAGNOSIS — Z853 Personal history of malignant neoplasm of breast: Secondary | ICD-10-CM | POA: Diagnosis not present

## 2016-02-24 DIAGNOSIS — Z7952 Long term (current) use of systemic steroids: Secondary | ICD-10-CM | POA: Diagnosis not present

## 2016-02-24 DIAGNOSIS — I10 Essential (primary) hypertension: Secondary | ICD-10-CM | POA: Diagnosis not present

## 2016-02-24 DIAGNOSIS — F0390 Unspecified dementia without behavioral disturbance: Secondary | ICD-10-CM | POA: Diagnosis not present

## 2016-02-24 DIAGNOSIS — I517 Cardiomegaly: Secondary | ICD-10-CM | POA: Diagnosis not present

## 2016-02-24 DIAGNOSIS — E785 Hyperlipidemia, unspecified: Secondary | ICD-10-CM | POA: Diagnosis not present

## 2016-02-24 DIAGNOSIS — Z170 Estrogen receptor positive status [ER+]: Secondary | ICD-10-CM | POA: Insufficient documentation

## 2016-02-24 DIAGNOSIS — H409 Unspecified glaucoma: Secondary | ICD-10-CM | POA: Diagnosis not present

## 2016-02-24 DIAGNOSIS — J209 Acute bronchitis, unspecified: Secondary | ICD-10-CM | POA: Diagnosis not present

## 2016-02-24 DIAGNOSIS — Z792 Long term (current) use of antibiotics: Secondary | ICD-10-CM | POA: Diagnosis not present

## 2016-02-24 LAB — SURGICAL PATHOLOGY

## 2016-02-24 MED ORDER — LETROZOLE 2.5 MG PO TABS: 2.5000 mg | ORAL_TABLET | Freq: Every day | ORAL | 3 refills | Status: DC

## 2016-02-24 NOTE — Progress Notes (Signed)
Here to discuss biopsy results of R Breast. Gave patient samples of Boost to take home. She has lost weight since recovering from the Flu.

## 2016-02-27 ENCOUNTER — Encounter: Payer: Self-pay | Admitting: Internal Medicine

## 2016-02-27 DIAGNOSIS — Z792 Long term (current) use of antibiotics: Secondary | ICD-10-CM | POA: Diagnosis not present

## 2016-02-27 DIAGNOSIS — J209 Acute bronchitis, unspecified: Secondary | ICD-10-CM | POA: Diagnosis not present

## 2016-02-27 DIAGNOSIS — W19XXXA Unspecified fall, initial encounter: Secondary | ICD-10-CM | POA: Insufficient documentation

## 2016-02-27 DIAGNOSIS — E039 Hypothyroidism, unspecified: Secondary | ICD-10-CM | POA: Diagnosis not present

## 2016-02-27 DIAGNOSIS — I10 Essential (primary) hypertension: Secondary | ICD-10-CM | POA: Diagnosis not present

## 2016-02-27 DIAGNOSIS — H409 Unspecified glaucoma: Secondary | ICD-10-CM | POA: Diagnosis not present

## 2016-02-27 DIAGNOSIS — Z9181 History of falling: Secondary | ICD-10-CM | POA: Diagnosis not present

## 2016-02-27 DIAGNOSIS — E785 Hyperlipidemia, unspecified: Secondary | ICD-10-CM | POA: Diagnosis not present

## 2016-02-27 DIAGNOSIS — M6281 Muscle weakness (generalized): Secondary | ICD-10-CM | POA: Diagnosis not present

## 2016-02-27 DIAGNOSIS — Z853 Personal history of malignant neoplasm of breast: Secondary | ICD-10-CM | POA: Diagnosis not present

## 2016-02-27 DIAGNOSIS — Z7952 Long term (current) use of systemic steroids: Secondary | ICD-10-CM | POA: Diagnosis not present

## 2016-02-27 NOTE — Assessment & Plan Note (Signed)
Has had weight loss.  Encourage increased po intake.  Follow.

## 2016-02-27 NOTE — Assessment & Plan Note (Signed)
Doing better s/p treatment.  Breathing better.  Follow.

## 2016-02-27 NOTE — Assessment & Plan Note (Signed)
Felt to be related to increased cough and being sick.  Worked up in the hospital.  Treated for infection.  Follow.

## 2016-02-27 NOTE — Assessment & Plan Note (Signed)
Had the fall as outlined.  Did hit her head.  Has been sleeping more since home from hospital.  Discussed with her daughter today.  This could be related to the infection, etc.  May also be related to hitting her head.  Discussed obtaining a CT head to confirm no acute change, bleed, etc.  She declines at this time. States she is doing better now.  Has been over one week since the fall.  Will follow.  Evaluate if any symptom change or problems.

## 2016-02-27 NOTE — Assessment & Plan Note (Signed)
Had biopsy of mass.  Positive.  Has f/u planned with oncology to discuss treatment.

## 2016-02-27 NOTE — Assessment & Plan Note (Signed)
Blood pressure under good control.  Continue same medication regimen.  Follow pressures.  Follow metabolic panel.   

## 2016-02-28 ENCOUNTER — Encounter: Payer: Self-pay | Admitting: Diagnostic Radiology

## 2016-02-28 DIAGNOSIS — Z792 Long term (current) use of antibiotics: Secondary | ICD-10-CM | POA: Diagnosis not present

## 2016-02-28 DIAGNOSIS — J209 Acute bronchitis, unspecified: Secondary | ICD-10-CM | POA: Diagnosis not present

## 2016-02-28 DIAGNOSIS — M6281 Muscle weakness (generalized): Secondary | ICD-10-CM | POA: Diagnosis not present

## 2016-02-28 DIAGNOSIS — Z9181 History of falling: Secondary | ICD-10-CM | POA: Diagnosis not present

## 2016-02-28 DIAGNOSIS — I10 Essential (primary) hypertension: Secondary | ICD-10-CM | POA: Diagnosis not present

## 2016-02-28 DIAGNOSIS — H409 Unspecified glaucoma: Secondary | ICD-10-CM | POA: Diagnosis not present

## 2016-02-28 DIAGNOSIS — E785 Hyperlipidemia, unspecified: Secondary | ICD-10-CM | POA: Diagnosis not present

## 2016-02-28 DIAGNOSIS — Z853 Personal history of malignant neoplasm of breast: Secondary | ICD-10-CM | POA: Diagnosis not present

## 2016-02-28 DIAGNOSIS — E039 Hypothyroidism, unspecified: Secondary | ICD-10-CM | POA: Diagnosis not present

## 2016-02-28 DIAGNOSIS — Z7952 Long term (current) use of systemic steroids: Secondary | ICD-10-CM | POA: Diagnosis not present

## 2016-02-29 DIAGNOSIS — Z9181 History of falling: Secondary | ICD-10-CM | POA: Diagnosis not present

## 2016-02-29 DIAGNOSIS — I10 Essential (primary) hypertension: Secondary | ICD-10-CM | POA: Diagnosis not present

## 2016-02-29 DIAGNOSIS — M6281 Muscle weakness (generalized): Secondary | ICD-10-CM | POA: Diagnosis not present

## 2016-02-29 DIAGNOSIS — Z7952 Long term (current) use of systemic steroids: Secondary | ICD-10-CM | POA: Diagnosis not present

## 2016-02-29 DIAGNOSIS — Z792 Long term (current) use of antibiotics: Secondary | ICD-10-CM | POA: Diagnosis not present

## 2016-02-29 DIAGNOSIS — E039 Hypothyroidism, unspecified: Secondary | ICD-10-CM | POA: Diagnosis not present

## 2016-02-29 DIAGNOSIS — H409 Unspecified glaucoma: Secondary | ICD-10-CM | POA: Diagnosis not present

## 2016-02-29 DIAGNOSIS — J209 Acute bronchitis, unspecified: Secondary | ICD-10-CM | POA: Diagnosis not present

## 2016-02-29 DIAGNOSIS — E785 Hyperlipidemia, unspecified: Secondary | ICD-10-CM | POA: Diagnosis not present

## 2016-02-29 DIAGNOSIS — Z853 Personal history of malignant neoplasm of breast: Secondary | ICD-10-CM | POA: Diagnosis not present

## 2016-03-02 DIAGNOSIS — M6281 Muscle weakness (generalized): Secondary | ICD-10-CM | POA: Diagnosis not present

## 2016-03-02 DIAGNOSIS — H409 Unspecified glaucoma: Secondary | ICD-10-CM | POA: Diagnosis not present

## 2016-03-02 DIAGNOSIS — E785 Hyperlipidemia, unspecified: Secondary | ICD-10-CM | POA: Diagnosis not present

## 2016-03-02 DIAGNOSIS — J209 Acute bronchitis, unspecified: Secondary | ICD-10-CM | POA: Diagnosis not present

## 2016-03-02 DIAGNOSIS — Z9181 History of falling: Secondary | ICD-10-CM | POA: Diagnosis not present

## 2016-03-02 DIAGNOSIS — Z853 Personal history of malignant neoplasm of breast: Secondary | ICD-10-CM | POA: Diagnosis not present

## 2016-03-02 DIAGNOSIS — Z792 Long term (current) use of antibiotics: Secondary | ICD-10-CM | POA: Diagnosis not present

## 2016-03-02 DIAGNOSIS — I10 Essential (primary) hypertension: Secondary | ICD-10-CM | POA: Diagnosis not present

## 2016-03-02 DIAGNOSIS — E039 Hypothyroidism, unspecified: Secondary | ICD-10-CM | POA: Diagnosis not present

## 2016-03-02 DIAGNOSIS — Z7952 Long term (current) use of systemic steroids: Secondary | ICD-10-CM | POA: Diagnosis not present

## 2016-03-05 ENCOUNTER — Telehealth: Payer: Self-pay | Admitting: Internal Medicine

## 2016-03-05 NOTE — Telephone Encounter (Signed)
Per last o/v note you had discussed getting a CT head. They would like to schedule now if ok Pt is sleeping a lot and they are getting concerned.

## 2016-03-05 NOTE — Telephone Encounter (Signed)
Pt daughter called and wished to have her mom to have a scan done that Dr. Nicki Reaper had recommended earlier. Pt is sleeping a lot and they are getting concerned. Please advise, thank you.  Call Daughter @ [phone]

## 2016-03-05 NOTE — Telephone Encounter (Signed)
Pt daughter will take to [facility]

## 2016-03-05 NOTE — Telephone Encounter (Signed)
Spoke patient daughther states that she is sleeping all the time that she has bot been eating much. She will wake up for PT or nurse and go right back to sleep. Son states that today she was a little better asked for something to eat on her own. Should she make app to be seen or go to Ed?

## 2016-03-05 NOTE — Telephone Encounter (Signed)
It sounds like she was better and now with a change in her mental status.  Since this far out from fall and was better and not sleeping more, needs to be evaluated to confirm nothing more going on.  Will need labs and possibly scanning, etc.  Any reoccurring falls?  Recommend urgent care or ER today if acute change.

## 2016-03-05 NOTE — Telephone Encounter (Signed)
Needs evaluation to determine etiology of symptoms.  Would recommend evaluation.

## 2016-03-06 DIAGNOSIS — E785 Hyperlipidemia, unspecified: Secondary | ICD-10-CM | POA: Diagnosis not present

## 2016-03-06 DIAGNOSIS — J209 Acute bronchitis, unspecified: Secondary | ICD-10-CM | POA: Diagnosis not present

## 2016-03-06 DIAGNOSIS — Z853 Personal history of malignant neoplasm of breast: Secondary | ICD-10-CM | POA: Diagnosis not present

## 2016-03-06 DIAGNOSIS — Z9181 History of falling: Secondary | ICD-10-CM | POA: Diagnosis not present

## 2016-03-06 DIAGNOSIS — M6281 Muscle weakness (generalized): Secondary | ICD-10-CM | POA: Diagnosis not present

## 2016-03-06 DIAGNOSIS — H409 Unspecified glaucoma: Secondary | ICD-10-CM | POA: Diagnosis not present

## 2016-03-06 DIAGNOSIS — I10 Essential (primary) hypertension: Secondary | ICD-10-CM | POA: Diagnosis not present

## 2016-03-06 DIAGNOSIS — E039 Hypothyroidism, unspecified: Secondary | ICD-10-CM | POA: Diagnosis not present

## 2016-03-06 DIAGNOSIS — Z792 Long term (current) use of antibiotics: Secondary | ICD-10-CM | POA: Diagnosis not present

## 2016-03-06 DIAGNOSIS — Z7952 Long term (current) use of systemic steroids: Secondary | ICD-10-CM | POA: Diagnosis not present

## 2016-03-07 ENCOUNTER — Telehealth: Payer: Self-pay

## 2016-03-07 DIAGNOSIS — Z792 Long term (current) use of antibiotics: Secondary | ICD-10-CM | POA: Diagnosis not present

## 2016-03-07 DIAGNOSIS — H409 Unspecified glaucoma: Secondary | ICD-10-CM | POA: Diagnosis not present

## 2016-03-07 DIAGNOSIS — J209 Acute bronchitis, unspecified: Secondary | ICD-10-CM | POA: Diagnosis not present

## 2016-03-07 DIAGNOSIS — E039 Hypothyroidism, unspecified: Secondary | ICD-10-CM | POA: Diagnosis not present

## 2016-03-07 DIAGNOSIS — Z853 Personal history of malignant neoplasm of breast: Secondary | ICD-10-CM | POA: Diagnosis not present

## 2016-03-07 DIAGNOSIS — M6281 Muscle weakness (generalized): Secondary | ICD-10-CM | POA: Diagnosis not present

## 2016-03-07 DIAGNOSIS — I10 Essential (primary) hypertension: Secondary | ICD-10-CM | POA: Diagnosis not present

## 2016-03-07 DIAGNOSIS — Z9181 History of falling: Secondary | ICD-10-CM | POA: Diagnosis not present

## 2016-03-07 DIAGNOSIS — E785 Hyperlipidemia, unspecified: Secondary | ICD-10-CM | POA: Diagnosis not present

## 2016-03-07 DIAGNOSIS — J101 Influenza due to other identified influenza virus with other respiratory manifestations: Secondary | ICD-10-CM | POA: Diagnosis not present

## 2016-03-07 DIAGNOSIS — Z7952 Long term (current) use of systemic steroids: Secondary | ICD-10-CM | POA: Diagnosis not present

## 2016-03-07 NOTE — Telephone Encounter (Signed)
PPW received for home health certification placed in your ppw to fill out

## 2016-03-09 DIAGNOSIS — J209 Acute bronchitis, unspecified: Secondary | ICD-10-CM | POA: Diagnosis not present

## 2016-03-09 DIAGNOSIS — I10 Essential (primary) hypertension: Secondary | ICD-10-CM | POA: Diagnosis not present

## 2016-03-09 DIAGNOSIS — M6281 Muscle weakness (generalized): Secondary | ICD-10-CM | POA: Diagnosis not present

## 2016-03-09 DIAGNOSIS — Z792 Long term (current) use of antibiotics: Secondary | ICD-10-CM | POA: Diagnosis not present

## 2016-03-09 DIAGNOSIS — E785 Hyperlipidemia, unspecified: Secondary | ICD-10-CM | POA: Diagnosis not present

## 2016-03-09 DIAGNOSIS — Z7952 Long term (current) use of systemic steroids: Secondary | ICD-10-CM | POA: Diagnosis not present

## 2016-03-09 DIAGNOSIS — E039 Hypothyroidism, unspecified: Secondary | ICD-10-CM | POA: Diagnosis not present

## 2016-03-09 DIAGNOSIS — H409 Unspecified glaucoma: Secondary | ICD-10-CM | POA: Diagnosis not present

## 2016-03-09 DIAGNOSIS — Z853 Personal history of malignant neoplasm of breast: Secondary | ICD-10-CM | POA: Diagnosis not present

## 2016-03-09 DIAGNOSIS — Z9181 History of falling: Secondary | ICD-10-CM | POA: Diagnosis not present

## 2016-03-09 NOTE — Telephone Encounter (Signed)
Signed and placed in box.   

## 2016-03-12 NOTE — Telephone Encounter (Signed)
Original mailed back to [facility] in envelope provided by facility. Copy sent to scan with charge sheet. Address mailed to was: [address]  [place], [place]

## 2016-03-13 DIAGNOSIS — J209 Acute bronchitis, unspecified: Secondary | ICD-10-CM | POA: Diagnosis not present

## 2016-03-13 DIAGNOSIS — I10 Essential (primary) hypertension: Secondary | ICD-10-CM | POA: Diagnosis not present

## 2016-03-13 DIAGNOSIS — Z7952 Long term (current) use of systemic steroids: Secondary | ICD-10-CM | POA: Diagnosis not present

## 2016-03-13 DIAGNOSIS — E039 Hypothyroidism, unspecified: Secondary | ICD-10-CM | POA: Diagnosis not present

## 2016-03-13 DIAGNOSIS — H409 Unspecified glaucoma: Secondary | ICD-10-CM | POA: Diagnosis not present

## 2016-03-13 DIAGNOSIS — M6281 Muscle weakness (generalized): Secondary | ICD-10-CM | POA: Diagnosis not present

## 2016-03-13 DIAGNOSIS — Z792 Long term (current) use of antibiotics: Secondary | ICD-10-CM | POA: Diagnosis not present

## 2016-03-13 DIAGNOSIS — E785 Hyperlipidemia, unspecified: Secondary | ICD-10-CM | POA: Diagnosis not present

## 2016-03-13 DIAGNOSIS — Z853 Personal history of malignant neoplasm of breast: Secondary | ICD-10-CM | POA: Diagnosis not present

## 2016-03-13 DIAGNOSIS — Z9181 History of falling: Secondary | ICD-10-CM | POA: Diagnosis not present

## 2016-03-15 DIAGNOSIS — H409 Unspecified glaucoma: Secondary | ICD-10-CM | POA: Diagnosis not present

## 2016-03-15 DIAGNOSIS — Z9181 History of falling: Secondary | ICD-10-CM | POA: Diagnosis not present

## 2016-03-15 DIAGNOSIS — J209 Acute bronchitis, unspecified: Secondary | ICD-10-CM | POA: Diagnosis not present

## 2016-03-15 DIAGNOSIS — Z792 Long term (current) use of antibiotics: Secondary | ICD-10-CM | POA: Diagnosis not present

## 2016-03-15 DIAGNOSIS — I10 Essential (primary) hypertension: Secondary | ICD-10-CM | POA: Diagnosis not present

## 2016-03-15 DIAGNOSIS — E039 Hypothyroidism, unspecified: Secondary | ICD-10-CM | POA: Diagnosis not present

## 2016-03-15 DIAGNOSIS — E785 Hyperlipidemia, unspecified: Secondary | ICD-10-CM | POA: Diagnosis not present

## 2016-03-15 DIAGNOSIS — Z853 Personal history of malignant neoplasm of breast: Secondary | ICD-10-CM | POA: Diagnosis not present

## 2016-03-15 DIAGNOSIS — M6281 Muscle weakness (generalized): Secondary | ICD-10-CM | POA: Diagnosis not present

## 2016-03-15 DIAGNOSIS — Z7952 Long term (current) use of systemic steroids: Secondary | ICD-10-CM | POA: Diagnosis not present

## 2016-03-17 DIAGNOSIS — Z792 Long term (current) use of antibiotics: Secondary | ICD-10-CM | POA: Diagnosis not present

## 2016-03-17 DIAGNOSIS — M6281 Muscle weakness (generalized): Secondary | ICD-10-CM | POA: Diagnosis not present

## 2016-03-17 DIAGNOSIS — I10 Essential (primary) hypertension: Secondary | ICD-10-CM | POA: Diagnosis not present

## 2016-03-17 DIAGNOSIS — Z9181 History of falling: Secondary | ICD-10-CM | POA: Diagnosis not present

## 2016-03-17 DIAGNOSIS — E039 Hypothyroidism, unspecified: Secondary | ICD-10-CM | POA: Diagnosis not present

## 2016-03-17 DIAGNOSIS — Z853 Personal history of malignant neoplasm of breast: Secondary | ICD-10-CM | POA: Diagnosis not present

## 2016-03-17 DIAGNOSIS — Z7952 Long term (current) use of systemic steroids: Secondary | ICD-10-CM | POA: Diagnosis not present

## 2016-03-17 DIAGNOSIS — E785 Hyperlipidemia, unspecified: Secondary | ICD-10-CM | POA: Diagnosis not present

## 2016-03-17 DIAGNOSIS — H409 Unspecified glaucoma: Secondary | ICD-10-CM | POA: Diagnosis not present

## 2016-03-17 DIAGNOSIS — J209 Acute bronchitis, unspecified: Secondary | ICD-10-CM | POA: Diagnosis not present

## 2016-03-19 ENCOUNTER — Telehealth: Payer: Self-pay | Admitting: *Deleted

## 2016-03-19 NOTE — Telephone Encounter (Signed)
[facility] reported a missed physical therapy appointment for today. Pt did not feel up to participating today,however pt has made great progress.  [address] [facility] [phone]

## 2016-03-20 ENCOUNTER — Telehealth: Payer: Self-pay | Admitting: *Deleted

## 2016-03-20 ENCOUNTER — Telehealth: Payer: Self-pay | Admitting: Internal Medicine

## 2016-03-20 DIAGNOSIS — J209 Acute bronchitis, unspecified: Secondary | ICD-10-CM | POA: Diagnosis not present

## 2016-03-20 DIAGNOSIS — H409 Unspecified glaucoma: Secondary | ICD-10-CM | POA: Diagnosis not present

## 2016-03-20 DIAGNOSIS — Z853 Personal history of malignant neoplasm of breast: Secondary | ICD-10-CM | POA: Diagnosis not present

## 2016-03-20 DIAGNOSIS — I10 Essential (primary) hypertension: Secondary | ICD-10-CM | POA: Diagnosis not present

## 2016-03-20 DIAGNOSIS — E039 Hypothyroidism, unspecified: Secondary | ICD-10-CM | POA: Diagnosis not present

## 2016-03-20 DIAGNOSIS — E785 Hyperlipidemia, unspecified: Secondary | ICD-10-CM | POA: Diagnosis not present

## 2016-03-20 DIAGNOSIS — Z7952 Long term (current) use of systemic steroids: Secondary | ICD-10-CM | POA: Diagnosis not present

## 2016-03-20 DIAGNOSIS — Z9181 History of falling: Secondary | ICD-10-CM | POA: Diagnosis not present

## 2016-03-20 DIAGNOSIS — M6281 Muscle weakness (generalized): Secondary | ICD-10-CM | POA: Diagnosis not present

## 2016-03-20 DIAGNOSIS — Z792 Long term (current) use of antibiotics: Secondary | ICD-10-CM | POA: Diagnosis not present

## 2016-03-20 NOTE — Telephone Encounter (Signed)
Called to report that she thinks the Letrozole is causing her mothers change in condition. She is confused, not knowing the names of people she is around etc, she is sleeping all the time, she is not eating, and was incontinent which she has never been before. She started her Letrozole 03/01/16. Please advise.  She reports that she also has a call in to her PCP office

## 2016-03-20 NOTE — Telephone Encounter (Signed)
See other message

## 2016-03-20 NOTE — Telephone Encounter (Signed)
Advise to hold the letrozole and contact the PCP for another etiology. She sighed and then said OK thank you

## 2016-03-20 NOTE — Telephone Encounter (Signed)
ok 

## 2016-03-20 NOTE — Telephone Encounter (Signed)
Aldrin from [facility] called and is looking for verbal orders. Continue pt 2x for 3 weeks, home health aide 2x for 3 weeks to help with bathing,etc., 1 week 1 with nurse for medication management. Please advise, thank you!  Call Aldrin @ [phone]

## 2016-03-20 NOTE — Telephone Encounter (Signed)
Left message on voice mail to give ok for orders.

## 2016-03-20 NOTE — Telephone Encounter (Signed)
Ok to extend orders  

## 2016-03-20 NOTE — Telephone Encounter (Signed)
I do not think her symptoms are secondary to letrozole. But have patient stop medication and follow up with her PCP to see if there is another etiology.

## 2016-03-21 ENCOUNTER — Telehealth: Payer: Self-pay | Admitting: Internal Medicine

## 2016-03-21 NOTE — Telephone Encounter (Signed)
Confusion , staring , talking to someone who's not there Symptoms: bowel incontinence X 2 days , confusion not aware of people, not aware of day,  Time,  Gets up 1200 pm,  Missed physical therapy appointment , not eating, got up one day and didn't have clothes on just shirt and panties on,  lives with brother    Caller daughter Vaughan Basta [phone]

## 2016-03-21 NOTE — Telephone Encounter (Signed)
Pt daughter Vaughan Basta called and stated that pt is having some confusion, loss of appetite, sleeping a lot, and has been going to the bathroom on herself. Please advise, thank you!  Call pt @ [phone]

## 2016-03-22 NOTE — Telephone Encounter (Signed)
Spoke to Ellen Wright daughter , she hasn't spoke to mom this morning.   She states if she hasn't heard from usually means she is doing ok.   She will check in on her and if any mental status changes will take to ED.   She will keep [place] posted on how mom is doing.

## 2016-03-22 NOTE — Telephone Encounter (Signed)
If mental status changes, etc, needs to be evaluated asap.

## 2016-03-22 NOTE — Telephone Encounter (Signed)
Noted.  Is she having any problems now?  Eating?  If no issues, keep [place] posted.

## 2016-03-22 NOTE — Telephone Encounter (Signed)
Spoke with daughter advised to take mom to ED to get evaluated for mental status changes.  She states that patient was fine yesterday since stopping letrozole medication for as instructed by cancer center .  See message from oncology dated 03/20/16 that refers patient to PCP.  Please advise.

## 2016-03-26 DIAGNOSIS — E039 Hypothyroidism, unspecified: Secondary | ICD-10-CM | POA: Diagnosis not present

## 2016-03-26 DIAGNOSIS — Z7952 Long term (current) use of systemic steroids: Secondary | ICD-10-CM | POA: Diagnosis not present

## 2016-03-26 DIAGNOSIS — J209 Acute bronchitis, unspecified: Secondary | ICD-10-CM | POA: Diagnosis not present

## 2016-03-26 DIAGNOSIS — Z853 Personal history of malignant neoplasm of breast: Secondary | ICD-10-CM | POA: Diagnosis not present

## 2016-03-26 DIAGNOSIS — I10 Essential (primary) hypertension: Secondary | ICD-10-CM | POA: Diagnosis not present

## 2016-03-26 DIAGNOSIS — H409 Unspecified glaucoma: Secondary | ICD-10-CM | POA: Diagnosis not present

## 2016-03-26 DIAGNOSIS — E785 Hyperlipidemia, unspecified: Secondary | ICD-10-CM | POA: Diagnosis not present

## 2016-03-26 DIAGNOSIS — Z792 Long term (current) use of antibiotics: Secondary | ICD-10-CM | POA: Diagnosis not present

## 2016-03-26 DIAGNOSIS — Z9181 History of falling: Secondary | ICD-10-CM | POA: Diagnosis not present

## 2016-03-26 DIAGNOSIS — M6281 Muscle weakness (generalized): Secondary | ICD-10-CM | POA: Diagnosis not present

## 2016-03-27 ENCOUNTER — Ambulatory Visit (INDEPENDENT_AMBULATORY_CARE_PROVIDER_SITE_OTHER): Payer: Medicare Other | Admitting: Internal Medicine

## 2016-03-27 ENCOUNTER — Encounter: Payer: Self-pay | Admitting: Internal Medicine

## 2016-03-27 VITALS — BP 100/62 | HR 82 | Temp 98.6°F | Ht 60.0 in | Wt 101.2 lb

## 2016-03-27 DIAGNOSIS — C50411 Malignant neoplasm of upper-outer quadrant of right female breast: Secondary | ICD-10-CM

## 2016-03-27 DIAGNOSIS — I10 Essential (primary) hypertension: Secondary | ICD-10-CM | POA: Diagnosis not present

## 2016-03-27 DIAGNOSIS — E876 Hypokalemia: Secondary | ICD-10-CM | POA: Diagnosis not present

## 2016-03-27 DIAGNOSIS — R4182 Altered mental status, unspecified: Secondary | ICD-10-CM | POA: Diagnosis not present

## 2016-03-27 LAB — HEPATIC FUNCTION PANEL
ALBUMIN: 3.5 g/dL (ref 3.5–5.2)
ALT: 13 U/L (ref 0–35)
AST: 22 U/L (ref 0–37)
Alkaline Phosphatase: 63 U/L (ref 39–117)
Bilirubin, Direct: 0.1 mg/dL (ref 0.0–0.3)
Total Bilirubin: 0.4 mg/dL (ref 0.2–1.2)
Total Protein: 7.8 g/dL (ref 6.0–8.3)

## 2016-03-27 LAB — URINALYSIS, ROUTINE W REFLEX MICROSCOPIC
BILIRUBIN URINE: NEGATIVE
Hgb urine dipstick: NEGATIVE
KETONES UR: NEGATIVE
LEUKOCYTES UA: NEGATIVE
Nitrite: NEGATIVE
PH: 6 (ref 5.0–8.0)
RBC / HPF: NONE SEEN (ref 0–?)
Specific Gravity, Urine: 1.015 (ref 1.000–1.030)
Total Protein, Urine: NEGATIVE
UROBILINOGEN UA: 1 (ref 0.0–1.0)
Urine Glucose: NEGATIVE

## 2016-03-27 LAB — CBC WITH DIFFERENTIAL/PLATELET
Basophils Absolute: 0 10*3/uL (ref 0.0–0.1)
Basophils Relative: 1.2 % (ref 0.0–3.0)
EOS PCT: 1.1 % (ref 0.0–5.0)
Eosinophils Absolute: 0 10*3/uL (ref 0.0–0.7)
HCT: 42.8 % (ref 36.0–46.0)
Hemoglobin: 13.7 g/dL (ref 12.0–15.0)
LYMPHS ABS: 1 10*3/uL (ref 0.7–4.0)
Lymphocytes Relative: 35.2 % (ref 12.0–46.0)
MCHC: 32.1 g/dL (ref 30.0–36.0)
MCV: 89.9 fl (ref 78.0–100.0)
MONOS PCT: 14.6 % — AB (ref 3.0–12.0)
Monocytes Absolute: 0.4 10*3/uL (ref 0.1–1.0)
NEUTROS ABS: 1.3 10*3/uL — AB (ref 1.4–7.7)
NEUTROS PCT: 47.9 % (ref 43.0–77.0)
PLATELETS: 202 10*3/uL (ref 150.0–400.0)
RBC: 4.76 Mil/uL (ref 3.87–5.11)
RDW: 15.1 % (ref 11.5–15.5)
WBC: 2.8 10*3/uL — ABNORMAL LOW (ref 4.0–10.5)

## 2016-03-27 LAB — BASIC METABOLIC PANEL
BUN: 34 mg/dL — ABNORMAL HIGH (ref 6–23)
CALCIUM: 9.5 mg/dL (ref 8.4–10.5)
CHLORIDE: 104 meq/L (ref 96–112)
CO2: 33 meq/L — AB (ref 19–32)
Creatinine, Ser: 1.24 mg/dL — ABNORMAL HIGH (ref 0.40–1.20)
GFR: 51.66 mL/min — ABNORMAL LOW (ref >60.00)
Glucose, Bld: 72 mg/dL (ref 70–99)
POTASSIUM: 3.3 meq/L — AB (ref 3.5–5.1)
SODIUM: 142 meq/L (ref 135–145)

## 2016-03-27 NOTE — Progress Notes (Signed)
Pre-visit discussion using our clinic review tool. No additional management support is needed unless otherwise documented below in the visit note.  

## 2016-03-27 NOTE — Progress Notes (Signed)
Patient ID: Ellen Wright, female   DOB: 23-Feb-1918, 82 y.o.   MRN: [id number]   Subjective:    Patient ID: Ellen Wright, female    DOB: [birth year], 83 y.o.   MRN: [id number]  HPI  Patient here for a scheduled follow up appt.  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 Was started on letrozole.  Started having mental status changes.  Since stopping letrozole, symptoms have improved.  She is more awake and alert now.  Eating better.  More interactive.  Previously used the bathroom on herself.  (bowel movement).  This is better.  Pt reports she feels good and denies any pain.     Past Medical History:  Diagnosis Date  . Breast cancer ([place]) 2006   right breast cancer  . Hearing loss   . Hyperlipidemia   . Hypertension   . Hyperthyroidism    s/p ablation   Past Surgical History:  Procedure Laterality Date  . BREAST EXCISIONAL BIOPSY Right 2007   positive   Family History  Problem Relation Age of Onset  . Lung disease Brother    Social History   Social History  . Marital status: Widowed    Spouse name: N/A  . Number of children: N/A  . Years of education: N/A   Social History Main Topics  . Smoking status: Never Smoker  . Smokeless tobacco: Current User    Types: Snuff  . Alcohol use No  . Drug use: No  . Sexual activity: Not Asked   Other Topics Concern  . None   Social History Narrative  . None    Outpatient Encounter Prescriptions as of 03/27/2016  Medication Sig  . latanoprost (XALATAN) 0.005 % ophthalmic solution Place 1 drop into both eyes at bedtime.  Marland Kitchen letrozole (FEMARA) 2.5 MG tablet Take 1 tablet (2.5 mg total) by mouth daily. (Patient taking differently: Take 2.5 mg by mouth daily. ON HOLD 03/20/16)  . potassium chloride (MICRO-K) 10 MEQ CR capsule TAKE 1 CAPSULE BY MOUTH  DAILY  . ramipril (ALTACE) 2.5 MG capsule TAKE 1 CAPSULE BY MOUTH  DAILY  . triamterene-hydrochlorothiazide (MAXZIDE-25) 37.5-25 MG tablet TAKE 1 TABLET BY  MOUTH  DAILY   No facility-administered encounter medications on file as of 03/27/2016.     Review of Systems  Constitutional: Negative for unexpected weight change.       Appetite has improved.  Eating better now.    HENT: Negative for congestion and sinus pressure.   Respiratory: Negative for cough, chest tightness and shortness of breath.   Cardiovascular: Negative for chest pain, palpitations and leg swelling.  Gastrointestinal: Negative for abdominal pain, diarrhea, nausea and vomiting.  Genitourinary: Negative for difficulty urinating and dysuria.  Musculoskeletal: Negative for back pain and joint swelling.  Skin: Negative for color change and rash.  Neurological: Negative for dizziness and headaches.  Psychiatric/Behavioral: Negative for dysphoric mood.       Mental status improved.         Objective:     Blood pressure rechecked by me:  120/62  Physical Exam  Constitutional: She appears well-developed and well-nourished. No distress.  HENT:  Nose: Nose normal.  Mouth/Throat: Oropharynx is clear and moist.  Neck: Neck supple. No thyromegaly present.  Cardiovascular: Normal rate and regular rhythm.   Pulmonary/Chest: Breath sounds normal. No respiratory distress. She has no wheezes.  Abdominal: Soft. Bowel sounds are normal. There is no tenderness.  Musculoskeletal: She exhibits no edema or tenderness.  Lymphadenopathy:  She has no cervical adenopathy.  Skin: No rash noted. No erythema.  Psychiatric: She has a normal mood and affect. Her behavior is normal.    BP 100/62 (BP Location: Left Arm, Patient Position: Sitting, Cuff Size: Normal)   Pulse 82   Temp 98.6 F (37 C) (Oral)   Ht 5' (1.524 m)   Wt 101 lb 3.2 oz (45.9 kg)   SpO2 98%   BMI 19.76 kg/m  Wt Readings from Last 3 Encounters:  03/27/16 101 lb 3.2 oz (45.9 kg)  02/24/16 97 lb 10.6 oz (44.3 kg)  02/22/16 97 lb 12.8 oz (44.4 kg)     Lab Results  Component Value Date   WBC 2.8 (L) 03/27/2016     HGB 13.7 03/27/2016   HCT 42.8 03/27/2016   PLT 202.0 03/27/2016   GLUCOSE 72 03/27/2016   CHOL 148 03/27/2012   TRIG 59.0 03/27/2012   HDL 58.30 03/27/2012   LDLCALC 78 03/27/2012   ALT 13 03/27/2016   AST 22 03/27/2016   NA 142 03/27/2016   K 3.3 (L) 03/27/2016   CL 104 03/27/2016   CREATININE 1.24 (H) 03/27/2016   BUN 34 (H) 03/27/2016   CO2 33 (H) 03/27/2016   TSH 1.553 02/12/2016    [place] Rt Breast Bx W Loc Dev 1st Lesion Img Bx Spec US Guide  Addendum Date: 02/17/2016   ADDENDUM REPORT: 02/17/2016 16:59 ADDENDUM: Pathology results of the patient's right breast biopsy show invasive mammary carcinoma, no special type. Pathology results are concordant with imaging findings. I telephoned the pathology results to the patient's daughter, Vaughan Basta, as requested. She reports that her mother is doing well after the biopsy and has no concerns regarding the biopsy site. Our staff will inform Dr. Gary Fleet office of the biopsy results, and it is planned that the patient will follow-up with him in the clinic. Electronically Signed   By: Curlene Dolphin M.D.   On: 02/17/2016 16:59   Result Date: 02/17/2016 CLINICAL DATA:  83 year old female with highly suspicious right breast mass EXAM: ULTRASOUND GUIDED RIGHT BREAST CORE NEEDLE BIOPSY COMPARISON:  Previous exam(s). FINDINGS: I met with the patient and we discussed the procedure of ultrasound-guided biopsy, including benefits and alternatives. We discussed the high likelihood of a successful procedure. We discussed the risks of the procedure, including infection, bleeding, tissue injury, clip migration, and inadequate sampling. Informed written consent was given. The usual time-out protocol was performed immediately prior to the procedure. Using sterile technique and 1% Lidocaine as local anesthetic, under direct ultrasound visualization, a 14 gauge spring-loaded device was used to perform biopsy of a highly suspicious mass within the lateral right breast  using a medial to lateral approach. At the conclusion of the procedure a HydroMARK shape 4 tissue marker clip was deployed into the biopsy cavity. Clip deployment was visualized under ultrasound guidance. A post biopsy mammogram was not performed. IMPRESSION: Ultrasound guided biopsy of a highly suspicious mass within the lateral right breast. No apparent complications. Electronically Signed: By: Pamelia Hoit M.D. On: 02/16/2016 09:52       Assessment & Plan:   Problem List Items Addressed This Visit    Essential hypertension, benign - Primary    Blood pressure under good control.  Continue same medication regimen.  Follow pressures.  Follow metabolic panel.        Relevant Orders   Basic metabolic panel (Completed)   Hypokalemia    Recheck potassium to confirm stable.  Mental status change    Improved off letrozole.  Check routine labs and urine.  Follow.  Hold on further w/up since improved.        Relevant Orders   CBC with Differential/Platelet (Completed)   Hepatic function panel (Completed)   Urinalysis, Routine w reflex microscopic (Completed)   Urine Culture (Completed)   Primary cancer of upper outer quadrant of right female breast (HCC)    Off letrozole now.  Doing better off.  Follow.  Seeing oncology.            Einar Pheasant, MD

## 2016-03-28 ENCOUNTER — Telehealth: Payer: Self-pay

## 2016-03-28 DIAGNOSIS — E039 Hypothyroidism, unspecified: Secondary | ICD-10-CM | POA: Diagnosis not present

## 2016-03-28 DIAGNOSIS — E785 Hyperlipidemia, unspecified: Secondary | ICD-10-CM | POA: Diagnosis not present

## 2016-03-28 DIAGNOSIS — J209 Acute bronchitis, unspecified: Secondary | ICD-10-CM | POA: Diagnosis not present

## 2016-03-28 DIAGNOSIS — Z7952 Long term (current) use of systemic steroids: Secondary | ICD-10-CM | POA: Diagnosis not present

## 2016-03-28 DIAGNOSIS — I10 Essential (primary) hypertension: Secondary | ICD-10-CM | POA: Diagnosis not present

## 2016-03-28 DIAGNOSIS — H409 Unspecified glaucoma: Secondary | ICD-10-CM | POA: Diagnosis not present

## 2016-03-28 DIAGNOSIS — M6281 Muscle weakness (generalized): Secondary | ICD-10-CM | POA: Diagnosis not present

## 2016-03-28 DIAGNOSIS — Z9181 History of falling: Secondary | ICD-10-CM | POA: Diagnosis not present

## 2016-03-28 DIAGNOSIS — Z853 Personal history of malignant neoplasm of breast: Secondary | ICD-10-CM | POA: Diagnosis not present

## 2016-03-28 DIAGNOSIS — Z792 Long term (current) use of antibiotics: Secondary | ICD-10-CM | POA: Diagnosis not present

## 2016-03-28 LAB — URINE CULTURE: ORGANISM ID, BACTERIA: NO GROWTH

## 2016-03-28 NOTE — Telephone Encounter (Signed)
Pt has returned phone call from office. Please call her back.  Thanks.

## 2016-03-28 NOTE — Telephone Encounter (Signed)
Left message to return call to our office.  

## 2016-03-28 NOTE — Telephone Encounter (Signed)
Daughter informed she will call if any questions.

## 2016-03-28 NOTE — Telephone Encounter (Signed)
-----   Message from Einar Pheasant, MD sent at 03/28/2016  5:58 AM EDT ----- Notify pts daughter that Ms Denny kidney function tests are stable.  Potassium slightly decreased.  Make sure she is taking her potassium supplements daily.  Initial urine looks ok.  Awaiting urine culture results.  Liver function tests wnl.

## 2016-03-31 DIAGNOSIS — J209 Acute bronchitis, unspecified: Secondary | ICD-10-CM | POA: Diagnosis not present

## 2016-03-31 DIAGNOSIS — E039 Hypothyroidism, unspecified: Secondary | ICD-10-CM | POA: Diagnosis not present

## 2016-03-31 DIAGNOSIS — Z7952 Long term (current) use of systemic steroids: Secondary | ICD-10-CM | POA: Diagnosis not present

## 2016-03-31 DIAGNOSIS — E785 Hyperlipidemia, unspecified: Secondary | ICD-10-CM | POA: Diagnosis not present

## 2016-03-31 DIAGNOSIS — M6281 Muscle weakness (generalized): Secondary | ICD-10-CM | POA: Diagnosis not present

## 2016-03-31 DIAGNOSIS — Z9181 History of falling: Secondary | ICD-10-CM | POA: Diagnosis not present

## 2016-03-31 DIAGNOSIS — H409 Unspecified glaucoma: Secondary | ICD-10-CM | POA: Diagnosis not present

## 2016-03-31 DIAGNOSIS — I10 Essential (primary) hypertension: Secondary | ICD-10-CM | POA: Diagnosis not present

## 2016-03-31 DIAGNOSIS — Z853 Personal history of malignant neoplasm of breast: Secondary | ICD-10-CM | POA: Diagnosis not present

## 2016-03-31 DIAGNOSIS — Z792 Long term (current) use of antibiotics: Secondary | ICD-10-CM | POA: Diagnosis not present

## 2016-04-01 ENCOUNTER — Encounter: Payer: Self-pay | Admitting: Internal Medicine

## 2016-04-01 NOTE — Assessment & Plan Note (Signed)
Improved off letrozole.  Check routine labs and urine.  Follow.  Hold on further w/up since improved.

## 2016-04-01 NOTE — Assessment & Plan Note (Signed)
Recheck potassium to confirm stable.

## 2016-04-01 NOTE — Assessment & Plan Note (Signed)
Off letrozole now.  Doing better off.  Follow.  Seeing oncology.

## 2016-04-01 NOTE — Assessment & Plan Note (Signed)
Blood pressure under good control.  Continue same medication regimen.  Follow pressures.  Follow metabolic panel.   

## 2016-04-03 DIAGNOSIS — Z7952 Long term (current) use of systemic steroids: Secondary | ICD-10-CM | POA: Diagnosis not present

## 2016-04-03 DIAGNOSIS — H409 Unspecified glaucoma: Secondary | ICD-10-CM | POA: Diagnosis not present

## 2016-04-03 DIAGNOSIS — E785 Hyperlipidemia, unspecified: Secondary | ICD-10-CM | POA: Diagnosis not present

## 2016-04-03 DIAGNOSIS — I10 Essential (primary) hypertension: Secondary | ICD-10-CM | POA: Diagnosis not present

## 2016-04-03 DIAGNOSIS — J209 Acute bronchitis, unspecified: Secondary | ICD-10-CM | POA: Diagnosis not present

## 2016-04-03 DIAGNOSIS — E039 Hypothyroidism, unspecified: Secondary | ICD-10-CM | POA: Diagnosis not present

## 2016-04-03 DIAGNOSIS — Z792 Long term (current) use of antibiotics: Secondary | ICD-10-CM | POA: Diagnosis not present

## 2016-04-03 DIAGNOSIS — Z853 Personal history of malignant neoplasm of breast: Secondary | ICD-10-CM | POA: Diagnosis not present

## 2016-04-03 DIAGNOSIS — Z9181 History of falling: Secondary | ICD-10-CM | POA: Diagnosis not present

## 2016-04-03 DIAGNOSIS — M6281 Muscle weakness (generalized): Secondary | ICD-10-CM | POA: Diagnosis not present

## 2016-04-05 DIAGNOSIS — Z9181 History of falling: Secondary | ICD-10-CM | POA: Diagnosis not present

## 2016-04-05 DIAGNOSIS — Z7952 Long term (current) use of systemic steroids: Secondary | ICD-10-CM | POA: Diagnosis not present

## 2016-04-05 DIAGNOSIS — M6281 Muscle weakness (generalized): Secondary | ICD-10-CM | POA: Diagnosis not present

## 2016-04-05 DIAGNOSIS — Z792 Long term (current) use of antibiotics: Secondary | ICD-10-CM | POA: Diagnosis not present

## 2016-04-05 DIAGNOSIS — E785 Hyperlipidemia, unspecified: Secondary | ICD-10-CM | POA: Diagnosis not present

## 2016-04-05 DIAGNOSIS — H409 Unspecified glaucoma: Secondary | ICD-10-CM | POA: Diagnosis not present

## 2016-04-05 DIAGNOSIS — Z853 Personal history of malignant neoplasm of breast: Secondary | ICD-10-CM | POA: Diagnosis not present

## 2016-04-05 DIAGNOSIS — E039 Hypothyroidism, unspecified: Secondary | ICD-10-CM | POA: Diagnosis not present

## 2016-04-05 DIAGNOSIS — J209 Acute bronchitis, unspecified: Secondary | ICD-10-CM | POA: Diagnosis not present

## 2016-04-05 DIAGNOSIS — I10 Essential (primary) hypertension: Secondary | ICD-10-CM | POA: Diagnosis not present

## 2016-04-10 DIAGNOSIS — E785 Hyperlipidemia, unspecified: Secondary | ICD-10-CM | POA: Diagnosis not present

## 2016-04-10 DIAGNOSIS — Z792 Long term (current) use of antibiotics: Secondary | ICD-10-CM | POA: Diagnosis not present

## 2016-04-10 DIAGNOSIS — H409 Unspecified glaucoma: Secondary | ICD-10-CM | POA: Diagnosis not present

## 2016-04-10 DIAGNOSIS — I10 Essential (primary) hypertension: Secondary | ICD-10-CM | POA: Diagnosis not present

## 2016-04-10 DIAGNOSIS — Z853 Personal history of malignant neoplasm of breast: Secondary | ICD-10-CM | POA: Diagnosis not present

## 2016-04-10 DIAGNOSIS — J209 Acute bronchitis, unspecified: Secondary | ICD-10-CM | POA: Diagnosis not present

## 2016-04-10 DIAGNOSIS — Z7952 Long term (current) use of systemic steroids: Secondary | ICD-10-CM | POA: Diagnosis not present

## 2016-04-10 DIAGNOSIS — E039 Hypothyroidism, unspecified: Secondary | ICD-10-CM | POA: Diagnosis not present

## 2016-04-10 DIAGNOSIS — Z9181 History of falling: Secondary | ICD-10-CM | POA: Diagnosis not present

## 2016-04-10 DIAGNOSIS — M6281 Muscle weakness (generalized): Secondary | ICD-10-CM | POA: Diagnosis not present

## 2016-04-12 DIAGNOSIS — Z792 Long term (current) use of antibiotics: Secondary | ICD-10-CM | POA: Diagnosis not present

## 2016-04-12 DIAGNOSIS — Z7952 Long term (current) use of systemic steroids: Secondary | ICD-10-CM | POA: Diagnosis not present

## 2016-04-12 DIAGNOSIS — H409 Unspecified glaucoma: Secondary | ICD-10-CM | POA: Diagnosis not present

## 2016-04-12 DIAGNOSIS — Z9181 History of falling: Secondary | ICD-10-CM | POA: Diagnosis not present

## 2016-04-12 DIAGNOSIS — I10 Essential (primary) hypertension: Secondary | ICD-10-CM | POA: Diagnosis not present

## 2016-04-12 DIAGNOSIS — J209 Acute bronchitis, unspecified: Secondary | ICD-10-CM | POA: Diagnosis not present

## 2016-04-12 DIAGNOSIS — M6281 Muscle weakness (generalized): Secondary | ICD-10-CM | POA: Diagnosis not present

## 2016-04-12 DIAGNOSIS — E785 Hyperlipidemia, unspecified: Secondary | ICD-10-CM | POA: Diagnosis not present

## 2016-04-12 DIAGNOSIS — E039 Hypothyroidism, unspecified: Secondary | ICD-10-CM | POA: Diagnosis not present

## 2016-04-12 DIAGNOSIS — Z853 Personal history of malignant neoplasm of breast: Secondary | ICD-10-CM | POA: Diagnosis not present

## 2016-04-20 ENCOUNTER — Telehealth: Payer: Self-pay | Admitting: Internal Medicine

## 2016-04-20 NOTE — Telephone Encounter (Signed)
Spoke to pt daughter. Declined AWV in office. Pt daughter states [facility] nurse came to pt home and did AWV.

## 2016-04-23 ENCOUNTER — Other Ambulatory Visit: Payer: Self-pay | Admitting: Internal Medicine

## 2016-05-23 NOTE — Progress Notes (Deleted)
[facility]  Telephone:([phone] [phone]  ID: Daiva Huge OB: Aug 01, 1918  MR#: [id number]  OVZ#:[id number]  Patient Care Team: Einar Pheasant, MD as PCP - General (Internal Medicine)  CHIEF COMPLAINT: Clinical stage IIa ER/PR positive, HER-2 negative invasive carcinoma of the upper outer quadrant of the right breast.  INTERVAL HISTORY: Patient returns to clinic today for discussion of her biopsy results and treatment planning. Patient has significant dementia and the entire history is given by her daughter. Patient has offered no complaints. She has no new neurologic complaints. There is no report of any fevers or illnesses. She has no chest pain or shortness of breath. She has a fair appetite, but there there is no report of nausea, vomiting, constipation, or diarrhea. She has no urinary complaints. Patient otherwise feels well and offers no further specific complaints.  REVIEW OF SYSTEMS:   Review of Systems  Unable to perform ROS: Dementia    As per HPI. Otherwise, a complete review of systems is negative.  PAST MEDICAL HISTORY: Past Medical History:  Diagnosis Date  . Breast cancer ([place]) 2006   right breast cancer  . Hearing loss   . Hyperlipidemia   . Hypertension   . Hyperthyroidism    s/p ablation    PAST SURGICAL HISTORY: Past Surgical History:  Procedure Laterality Date  . BREAST EXCISIONAL BIOPSY Right 2007   positive    FAMILY HISTORY: Family History  Problem Relation Age of Onset  . Lung disease Brother     ADVANCED DIRECTIVES (Y/N):  N  HEALTH MAINTENANCE: Social History  Substance Use Topics  . Smoking status: Never Smoker  . Smokeless tobacco: Current User    Types: Snuff  . Alcohol use No     Colonoscopy:  PAP:  Bone density:  Lipid panel:  Allergies  Allergen Reactions  . Penicillins     Current Outpatient Prescriptions  Medication Sig Dispense Refill  . latanoprost (XALATAN) 0.005 % ophthalmic  solution Place 1 drop into both eyes at bedtime.  10  . letrozole (FEMARA) 2.5 MG tablet Take 1 tablet (2.5 mg total) by mouth daily. (Patient taking differently: Take 2.5 mg by mouth daily. ON HOLD 03/20/16) 90 tablet 3  . potassium chloride (MICRO-K) 10 MEQ CR capsule TAKE 1 CAPSULE BY MOUTH  DAILY 90 capsule 2  . ramipril (ALTACE) 2.5 MG capsule TAKE 1 CAPSULE BY MOUTH  DAILY 90 capsule 0  . triamterene-hydrochlorothiazide (MAXZIDE-25) 37.5-25 MG tablet TAKE 1 TABLET BY MOUTH  DAILY 90 tablet 1   No current facility-administered medications for this visit.     OBJECTIVE: There were no vitals filed for this visit.   There is no height or weight on file to calculate BMI.    ECOG FS:2 - Symptomatic, <50% confined to bed  General: Well-developed, well-nourished, no acute distress. Eyes: Pink conjunctiva, anicteric sclera. Breasts: Easily palpable 1-2 cm breast mass and upper outer quadrant. Multiple axillary lymph nodes are also palpable. Lungs: Clear to auscultation bilaterally. Heart: Regular rate and rhythm. No rubs, murmurs, or gallops. Abdomen: Soft, nontender, nondistended. No organomegaly noted, normoactive bowel sounds. Musculoskeletal: No edema, cyanosis, or clubbing. Neuro: Alert, answering all questions appropriately. Cranial nerves grossly intact. Skin: No rashes or petechiae noted. Psych: Normal affect. Lymphatics: No cervical, calvicular, axillary or inguinal LAD.   LAB RESULTS:  Lab Results  Component Value Date   NA 142 03/27/2016   K 3.3 (L) 03/27/2016   CL 104 03/27/2016   CO2 33 (H) 03/27/2016  GLUCOSE 72 03/27/2016   BUN 34 (H) 03/27/2016   CREATININE 1.24 (H) 03/27/2016   CALCIUM 9.5 03/27/2016   PROT 7.8 03/27/2016   ALBUMIN 3.5 03/27/2016   AST 22 03/27/2016   ALT 13 03/27/2016   ALKPHOS 63 03/27/2016   BILITOT 0.4 03/27/2016   GFRNONAA 36 (L) 02/13/2016   GFRAA 42 (L) 02/13/2016    Lab Results  Component Value Date   WBC 2.8 (L) 03/27/2016    NEUTROABS 1.3 (L) 03/27/2016   HGB 13.7 03/27/2016   HCT 42.8 03/27/2016   MCV 89.9 03/27/2016   PLT 202.0 03/27/2016   Lab Results  Component Value Date   LABCA2 17.9 12/09/2015     STUDIES: No results found.  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  PLAN:    1. 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 Patient's original diagnosis in 2007 was clinical stage IA ER/PR positive, HER-2 negative adenocarcinoma of the right breast. She underwent lumpectomy, but declined adjuvant XRT. She completed 5 years of Femara in May 2012. Patient and daughter declined any further surgery, radiation, or XRT. Patient was given a prescription for letrozole again today. Return to clinic in 3 months for further evaluation.   Approximately 20 minutes was spent in discussion of which greater than 50% was consultation.  Patient expressed understanding and was in agreement with this plan. She also understands that She can call clinic at any time with any questions, concerns, or complaints.   Cancer Staging Primary cancer of upper outer quadrant of right female breast [facility]) Staging form: Breast, AJCC 8th Edition - Clinical stage from 02/22/2016: Stage IIA (cT2, cN1, cM0, G2, ER: Positive, PR: Positive, HER2: Equivocal) - Signed by Lloyd Huger, MD on 02/22/2016   Lloyd Huger, MD   05/23/2016 11:07 PM

## 2016-05-25 ENCOUNTER — Inpatient Hospital Stay: Payer: Medicare Other | Attending: Oncology | Admitting: Oncology

## 2016-05-25 ENCOUNTER — Inpatient Hospital Stay: Payer: Medicare Other

## 2016-06-06 NOTE — Progress Notes (Deleted)
[facility]  Telephone:([phone] [phone]  ID: Daiva Huge OB: Mar 25, 1918  MR#: [id number]  KGY#:[id number]  Patient Care Team: Einar Pheasant, MD as PCP - General (Internal Medicine)  CHIEF COMPLAINT: Clinical stage IIa ER/PR positive, HER-2 negative invasive carcinoma of the upper outer quadrant of the right breast.  INTERVAL HISTORY: Patient returns to clinic today for discussion of her biopsy results and treatment planning. Patient has significant dementia and the entire history is given by her daughter. Patient has offered no complaints. She has no new neurologic complaints. There is no report of any fevers or illnesses. She has no chest pain or shortness of breath. She has a fair appetite, but there there is no report of nausea, vomiting, constipation, or diarrhea. She has no urinary complaints. Patient otherwise feels well and offers no further specific complaints.  REVIEW OF SYSTEMS:   Review of Systems  Unable to perform ROS: Dementia    As per HPI. Otherwise, a complete review of systems is negative.  PAST MEDICAL HISTORY: Past Medical History:  Diagnosis Date  . Breast cancer ([place]) 2006   right breast cancer  . Hearing loss   . Hyperlipidemia   . Hypertension   . Hyperthyroidism    s/p ablation    PAST SURGICAL HISTORY: Past Surgical History:  Procedure Laterality Date  . BREAST EXCISIONAL BIOPSY Right 2007   positive    FAMILY HISTORY: Family History  Problem Relation Age of Onset  . Lung disease Brother     ADVANCED DIRECTIVES (Y/N):  N  HEALTH MAINTENANCE: Social History  Substance Use Topics  . Smoking status: Never Smoker  . Smokeless tobacco: Current User    Types: Snuff  . Alcohol use No     Colonoscopy:  PAP:  Bone density:  Lipid panel:  Allergies  Allergen Reactions  . Penicillins     Current Outpatient Prescriptions  Medication Sig Dispense Refill  . latanoprost (XALATAN) 0.005 % ophthalmic  solution Place 1 drop into both eyes at bedtime.  10  . letrozole (FEMARA) 2.5 MG tablet Take 1 tablet (2.5 mg total) by mouth daily. (Patient taking differently: Take 2.5 mg by mouth daily. ON HOLD 03/20/16) 90 tablet 3  . potassium chloride (MICRO-K) 10 MEQ CR capsule TAKE 1 CAPSULE BY MOUTH  DAILY 90 capsule 2  . ramipril (ALTACE) 2.5 MG capsule TAKE 1 CAPSULE BY MOUTH  DAILY 90 capsule 0  . triamterene-hydrochlorothiazide (MAXZIDE-25) 37.5-25 MG tablet TAKE 1 TABLET BY MOUTH  DAILY 90 tablet 1   No current facility-administered medications for this visit.     OBJECTIVE: There were no vitals filed for this visit.   There is no height or weight on file to calculate BMI.    ECOG FS:2 - Symptomatic, <50% confined to bed  General: Well-developed, well-nourished, no acute distress. Eyes: Pink conjunctiva, anicteric sclera. Breasts: Easily palpable 1-2 cm breast mass and upper outer quadrant. Multiple axillary lymph nodes are also palpable. Lungs: Clear to auscultation bilaterally. Heart: Regular rate and rhythm. No rubs, murmurs, or gallops. Abdomen: Soft, nontender, nondistended. No organomegaly noted, normoactive bowel sounds. Musculoskeletal: No edema, cyanosis, or clubbing. Neuro: Alert, answering all questions appropriately. Cranial nerves grossly intact. Skin: No rashes or petechiae noted. Psych: Normal affect. Lymphatics: No cervical, calvicular, axillary or inguinal LAD.   LAB RESULTS:  Lab Results  Component Value Date   NA 142 03/27/2016   K 3.3 (L) 03/27/2016   CL 104 03/27/2016   CO2 33 (H) 03/27/2016  GLUCOSE 72 03/27/2016   BUN 34 (H) 03/27/2016   CREATININE 1.24 (H) 03/27/2016   CALCIUM 9.5 03/27/2016   PROT 7.8 03/27/2016   ALBUMIN 3.5 03/27/2016   AST 22 03/27/2016   ALT 13 03/27/2016   ALKPHOS 63 03/27/2016   BILITOT 0.4 03/27/2016   GFRNONAA 36 (L) 02/13/2016   GFRAA 42 (L) 02/13/2016    Lab Results  Component Value Date   WBC 2.8 (L) 03/27/2016    NEUTROABS 1.3 (L) 03/27/2016   HGB 13.7 03/27/2016   HCT 42.8 03/27/2016   MCV 89.9 03/27/2016   PLT 202.0 03/27/2016   Lab Results  Component Value Date   LABCA2 17.9 12/09/2015     STUDIES: No results found.  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  PLAN:    1. 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 Patient's original diagnosis in 2007 was clinical stage IA ER/PR positive, HER-2 negative adenocarcinoma of the right breast. She underwent lumpectomy, but declined adjuvant XRT. She completed 5 years of Femara in May 2012. Patient and daughter declined any further surgery, radiation, or XRT. Patient was given a prescription for letrozole again today. Return to clinic in 3 months for further evaluation.   Approximately 20 minutes was spent in discussion of which greater than 50% was consultation.  Patient expressed understanding and was in agreement with this plan. She also understands that She can call clinic at any time with any questions, concerns, or complaints.   Cancer Staging Primary cancer of upper outer quadrant of right female breast [facility]) Staging form: Breast, AJCC 8th Edition - Clinical stage from 02/22/2016: Stage IIA (cT2, cN1, cM0, G2, ER: Positive, PR: Positive, HER2: Equivocal) - Signed by Lloyd Huger, MD on 02/22/2016   Lloyd Huger, MD   06/06/2016 10:06 PM

## 2016-06-08 ENCOUNTER — Other Ambulatory Visit: Payer: Medicare Other

## 2016-06-08 ENCOUNTER — Ambulatory Visit: Payer: Medicare Other | Admitting: Oncology

## 2016-06-14 NOTE — Progress Notes (Deleted)
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  INTERVAL HISTORY: Patient returns to clinic today for discussion of her biopsy results and treatment planning. Patient has significant dementia and the entire history is given by her daughter. Patient has offered no complaints. She has no new neurologic complaints. There is no report of any fevers or illnesses. She has no chest pain or shortness of breath. She has a fair appetite, but there there is no report of nausea, vomiting, constipation, or diarrhea. She has no urinary complaints. Patient otherwise feels well and offers no further specific complaints.  REVIEW OF SYSTEMS:   Review of Systems  Unable to perform ROS: Dementia    As per HPI. Otherwise, a complete review of systems is negative.  PAST MEDICAL HISTORY: Past Medical History:  Diagnosis Date  . Breast cancer ([place]) 2006   right breast cancer  . Hearing loss   . Hyperlipidemia   . Hypertension   . Hyperthyroidism    s/p ablation    PAST SURGICAL HISTORY: Past Surgical History:  Procedure Laterality Date  . BREAST EXCISIONAL BIOPSY Right 2007   positive    FAMILY HISTORY: Family History  Problem Relation Age of Onset  . Lung disease Brother     ADVANCED DIRECTIVES (Y/N):  N  HEALTH MAINTENANCE: Social History  Substance Use Topics  . Smoking status: Never Smoker  . Smokeless tobacco: Current User    Types: Snuff  . Alcohol use No     Colonoscopy:  PAP:  Bone density:  Lipid panel:  Allergies  Allergen Reactions  . Penicillins     Current Outpatient Prescriptions  Medication Sig Dispense Refill  . latanoprost (XALATAN) 0.005 % ophthalmic  solution Place 1 drop into both eyes at bedtime.  10  . letrozole (FEMARA) 2.5 MG tablet Take 1 tablet (2.5 mg total) by mouth daily. (Patient taking differently: Take 2.5 mg by mouth daily. ON HOLD 03/20/16) 90 tablet 3  . potassium chloride (MICRO-K) 10 MEQ CR capsule TAKE 1 CAPSULE BY MOUTH  DAILY 90 capsule 2  . ramipril (ALTACE) 2.5 MG capsule TAKE 1 CAPSULE BY MOUTH  DAILY 90 capsule 0  . triamterene-hydrochlorothiazide (MAXZIDE-25) 37.5-25 MG tablet TAKE 1 TABLET BY MOUTH  DAILY 90 tablet 1   No current facility-administered medications for this visit.     OBJECTIVE: There were no vitals filed for this visit.   There is no height or weight on file to calculate BMI.    ECOG FS:2 - Symptomatic, <50% confined to bed  General: Well-developed, well-nourished, no acute distress. Eyes: Pink conjunctiva, anicteric sclera. Breasts: Easily palpable 1-2 cm breast mass and upper outer quadrant. Multiple axillary lymph nodes are also palpable. Lungs: Clear to auscultation bilaterally. Heart: Regular rate and rhythm. No rubs, murmurs, or gallops. Abdomen: Soft, nontender, nondistended. No organomegaly noted, normoactive bowel sounds. Musculoskeletal: No edema, cyanosis, or clubbing. Neuro: Alert, answering all questions appropriately. Cranial nerves grossly intact. Skin: No rashes or petechiae noted. Psych: Normal affect. Lymphatics: No cervical, calvicular, axillary or inguinal LAD.   LAB RESULTS:  Lab Results  Component Value Date   NA 142 03/27/2016   K 3.3 (L) 03/27/2016   CL 104 03/27/2016   CO2 33 (H) 03/27/2016  GLUCOSE 72 03/27/2016   BUN 34 (H) 03/27/2016   CREATININE 1.24 (H) 03/27/2016   CALCIUM 9.5 03/27/2016   PROT 7.8 03/27/2016   ALBUMIN 3.5 03/27/2016   AST 22 03/27/2016   ALT 13 03/27/2016   ALKPHOS 63 03/27/2016   BILITOT 0.4 03/27/2016   GFRNONAA 36 (L) 02/13/2016   GFRAA 42 (L) 02/13/2016    Lab Results  Component Value Date   WBC 2.8 (L) 03/27/2016    NEUTROABS 1.3 (L) 03/27/2016   HGB 13.7 03/27/2016   HCT 42.8 03/27/2016   MCV 89.9 03/27/2016   PLT 202.0 03/27/2016   Lab Results  Component Value Date   LABCA2 17.9 12/09/2015     STUDIES: No results found.  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  PLAN:    1. 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 Patient's original diagnosis in 2007 was clinical stage IA ER/PR positive, HER-2 negative adenocarcinoma of the right breast. She underwent lumpectomy, but declined adjuvant XRT. She completed 5 years of Femara in May 2012. Patient and daughter declined any further surgery, radiation, or XRT. Patient was given a prescription for letrozole again today. Return to clinic in 3 months for further evaluation.   Approximately 20 minutes was spent in discussion of which greater than 50% was consultation.  Patient expressed understanding and was in agreement with this plan. She also understands that She can call clinic at any time with any questions, concerns, or complaints.   Cancer Staging Primary cancer of upper outer quadrant of right female breast [facility]) Staging form: Breast, AJCC 8th Edition - Clinical stage from 02/22/2016: Stage IIA (cT2, cN1, cM0, G2, ER: Positive, PR: Positive, HER2: Equivocal) - Signed by Lloyd Huger, MD on 02/22/2016   Lloyd Huger, MD   06/14/2016 10:45 PM

## 2016-06-15 ENCOUNTER — Ambulatory Visit (INDEPENDENT_AMBULATORY_CARE_PROVIDER_SITE_OTHER): Payer: Medicare Other | Admitting: Family

## 2016-06-15 ENCOUNTER — Inpatient Hospital Stay: Payer: Medicare Other | Attending: Oncology

## 2016-06-15 ENCOUNTER — Encounter: Payer: Self-pay | Admitting: Family

## 2016-06-15 ENCOUNTER — Inpatient Hospital Stay: Payer: Medicare Other | Admitting: Oncology

## 2016-06-15 VITALS — BP 144/78 | HR 84 | Temp 97.8°F | Ht 60.0 in | Wt 101.4 lb

## 2016-06-15 DIAGNOSIS — I739 Peripheral vascular disease, unspecified: Secondary | ICD-10-CM | POA: Diagnosis not present

## 2016-06-15 DIAGNOSIS — B351 Tinea unguium: Secondary | ICD-10-CM | POA: Diagnosis not present

## 2016-06-15 NOTE — Assessment & Plan Note (Signed)
Referral to podiatry for treatment, removal.

## 2016-06-15 NOTE — Progress Notes (Signed)
Subjective:    Patient ID: Ellen Wright, female    DOB: [birth year], 83 y.o.   MRN: [id number]  CC: Ellen Wright is a 83 y.o. female who presents today for an acute visit.    HPI: Left great toenail discoloration and thickness, for several months, unchanged. No pain.   Also noticed that bilateral toes are discolored for past couple of months, unchanged. No poor healing wounds. No CP, palpitations, SOB, leg swelling   Accompanied by son who is the [place].         HISTORY:  Past Medical History:  Diagnosis Date  . Breast cancer ([place]) 2006   right breast cancer  . Hearing loss   . Hyperlipidemia   . Hypertension   . Hyperthyroidism    s/p ablation   Past Surgical History:  Procedure Laterality Date  . BREAST EXCISIONAL BIOPSY Right 2007   positive   Family History  Problem Relation Age of Onset  . Lung disease Brother     Allergies: Penicillins Current Outpatient Prescriptions on File Prior to Visit  Medication Sig Dispense Refill  . latanoprost (XALATAN) 0.005 % ophthalmic solution Place 1 drop into both eyes at bedtime.  10  . letrozole (FEMARA) 2.5 MG tablet Take 1 tablet (2.5 mg total) by mouth daily. (Patient taking differently: Take 2.5 mg by mouth daily. ON HOLD 03/20/16) 90 tablet 3  . potassium chloride (MICRO-K) 10 MEQ CR capsule TAKE 1 CAPSULE BY MOUTH  DAILY 90 capsule 2  . ramipril (ALTACE) 2.5 MG capsule TAKE 1 CAPSULE BY MOUTH  DAILY 90 capsule 0  . triamterene-hydrochlorothiazide (MAXZIDE-25) 37.5-25 MG tablet TAKE 1 TABLET BY MOUTH  DAILY 90 tablet 1   No current facility-administered medications on file prior to visit.     Social History  Substance Use Topics  . Smoking status: Never Smoker  . Smokeless tobacco: Current User    Types: Snuff  . Alcohol use No    Review of Systems  Constitutional: Negative for chills and fever.  Respiratory: Negative for cough and shortness of breath.   Cardiovascular: Negative for chest pain,  palpitations and leg swelling.  Gastrointestinal: Negative for nausea and vomiting.      Objective:    BP (!) 144/78   Pulse 84   Temp 97.8 F (36.6 C) (Oral)   Ht 5' (1.524 m)   Wt 101 lb 6.4 oz (46 kg)   SpO2 98%   BMI 19.80 kg/m    Physical Exam  Constitutional: She appears well-developed and well-nourished.  Eyes: Conjunctivae are normal.  Cardiovascular: Normal rate, regular rhythm, normal heart sounds and normal pulses.   No LE edema, palpable cords or masses. No erythema or increased warmth. No asymmetry in calf size when compared bilaterally LE hair growth symmetric and present. No discoloration of varicosities noted. LE warm and palpable pedal pulses.    Pulmonary/Chest: Effort normal and breath sounds normal. She has no wheezes. She has no rhonchi. She has no rales.  Musculoskeletal:       Feet:  Thickened, yellow great toenail  Neurological: She is alert.  Skin: Skin is warm and dry.  Darker coloration of bilateral metacarpals.   Psychiatric: She has a normal mood and affect. Her speech is normal and behavior is normal. Thought content normal.  Vitals reviewed.      Assessment & Plan:   Problem List Items Addressed This Visit      Cardiovascular and Mediastinum   PVD (peripheral vascular disease) ([place])  Concern for peripheral vascular disease. Patient's extremities are warm however there is a slight discoloration to bilateral toes. No wounds. Son and I jointly agreed consult with vascular was appropriate next step.      Relevant Orders   Ambulatory referral to Vascular Surgery     Musculoskeletal and Integument   Onychomycosis of left great toe - Primary    Referral to podiatry for treatment, removal.       Relevant Orders   Ambulatory referral to Podiatry        I am having [name] maintain her potassium chloride, triamterene-hydrochlorothiazide, latanoprost, letrozole, and ramipril.   No orders of the defined types were placed in  this encounter.   Return precautions given.   Risks, benefits, and alternatives of the medications and treatment plan prescribed today were discussed, and patient expressed understanding.   Education regarding symptom management and diagnosis given to patient on AVS.  Continue to follow with Ellen Pheasant, MD for routine health maintenance.   Ellen Wright and I agreed with plan.   Ellen Paris, FNP

## 2016-06-15 NOTE — Assessment & Plan Note (Signed)
Concern for peripheral vascular disease. Patient's extremities are warm however there is a slight discoloration to bilateral toes. No wounds. Son and I jointly agreed consult with vascular was appropriate next step.

## 2016-06-15 NOTE — Patient Instructions (Addendum)
Pleasure meeting you  Referrals placed  Let us know if you need anything

## 2016-06-15 NOTE — Progress Notes (Signed)
Pre visit review using our clinic review tool, if applicable. No additional management support is needed unless otherwise documented below in the visit note. 

## 2016-06-20 ENCOUNTER — Ambulatory Visit (INDEPENDENT_AMBULATORY_CARE_PROVIDER_SITE_OTHER): Payer: Medicare Other | Admitting: Vascular Surgery

## 2016-06-20 ENCOUNTER — Other Ambulatory Visit (INDEPENDENT_AMBULATORY_CARE_PROVIDER_SITE_OTHER): Payer: Medicare Other

## 2016-06-20 ENCOUNTER — Telehealth: Payer: Self-pay | Admitting: *Deleted

## 2016-06-20 ENCOUNTER — Encounter (INDEPENDENT_AMBULATORY_CARE_PROVIDER_SITE_OTHER): Payer: Self-pay | Admitting: Vascular Surgery

## 2016-06-20 VITALS — BP 154/83 | HR 94 | Resp 16 | Ht 60.0 in | Wt 100.0 lb

## 2016-06-20 DIAGNOSIS — I739 Peripheral vascular disease, unspecified: Secondary | ICD-10-CM

## 2016-06-20 DIAGNOSIS — I10 Essential (primary) hypertension: Secondary | ICD-10-CM

## 2016-06-20 NOTE — Progress Notes (Addendum)
Subjective:    Patient ID: Ellen Wright, female    DOB: [birth year], 83 y.o.   MRN: [id number] Chief Complaint  Patient presents with  . New Patient (Initial Visit)   Presents as a new patient referred by Dr. Vidal Schwalbe for evaluation of peripheral artery disease. Patient seen with family member. Patient is very hard of hearing. History gathered from family member. Recently, the patient has been experiencing left lower extremity edema located in her toes. It doesn't seem to hurt the patient. The patient recently lost her right big toe nail which is slow to grow back. Patient seems to be walking fine however family member reports that she fears falling. Denies any recent nausea, vomiting or fever. There is no recent trauma to the left lower extremity. There is no symptoms relating to the right lower extremity.    Review of Systems  Constitutional: Negative.   HENT: Negative.   Eyes: Negative.   Respiratory: Negative.   Cardiovascular: Positive for leg swelling.       Left lower extremity edema  Gastrointestinal: Negative.   Endocrine: Negative.   Genitourinary: Negative.   Musculoskeletal: Negative.   Skin: Negative.   Allergic/Immunologic: Negative.   Neurological: Negative.   Hematological: Negative.   Psychiatric/Behavioral: Negative.        Objective:   Physical Exam  Constitutional: She is oriented to person, place, and time. She appears well-developed and well-nourished. No distress.  HENT:  Head: Normocephalic and atraumatic.  Eyes: Conjunctivae are normal. Pupils are equal, round, and reactive to light.  Neck: Normal range of motion.  Cardiovascular: Normal rate, regular rhythm, normal heart sounds and intact distal pulses.   Pulses:      Radial pulses are 2+ on the right side, and 2+ on the left side.  Unable to palpate pedal pulses. Leg is warm to about mid foot then becomes cooler.  Pulmonary/Chest: Effort normal.  Musculoskeletal: She exhibits edema (Edema noted to  the left lower extremity toes. Skin is intact. There is no cellulitis).  Neurological: She is alert and oriented to person, place, and time.  Skin: She is not diaphoretic.  Psychiatric: She has a normal mood and affect. Her behavior is normal. Judgment and thought content normal.  Vitals reviewed.   BP (!) 154/83   Pulse 94   Resp 16   Ht 5' (1.524 m)   Wt 100 lb (45.4 kg)   BMI 19.53 kg/m   Past Medical History:  Diagnosis Date  . Breast cancer ([place]) 2006   right breast cancer  . Hearing loss   . Hyperlipidemia   . Hypertension   . Hyperthyroidism    s/p ablation    Social History   Social History  . Marital status: Widowed    Spouse name: N/A  . Number of children: N/A  . Years of education: N/A   Occupational History  . Not on file.   Social History Main Topics  . Smoking status: Never Smoker  . Smokeless tobacco: Current User    Types: Snuff  . Alcohol use No  . Drug use: No  . Sexual activity: Not on file   Other Topics Concern  . Not on file   Social History Narrative  . No narrative on file    Past Surgical History:  Procedure Laterality Date  . BREAST EXCISIONAL BIOPSY Right 2007   positive    Family History  Problem Relation Age of Onset  . Lung disease Brother     Allergies  Allergen Reactions  . Penicillins        Assessment & Plan:  Presents as a new patient referred by Dr. Vidal Schwalbe for evaluation of peripheral artery disease. Patient seen with family member. Patient is very hard of hearing. History gathered from family member. Recently, the patient has been experiencing left lower extremity edema located in her toes. It doesn't seem to hurt the patient. The patient recently lost her right big toe nail which is slow to grow back. Patient seems to be walking fine however family member reports that she fears falling. Denies any recent nausea, vomiting or fever. There is no recent trauma to the left lower extremity. There is no symptoms  relating to the right lower extremity.   1. PVD (peripheral vascular disease) ([place]) - new Patient with increasing edema left lower extremity toes. Unable to palpate pedal pulses to the left lower extremity. Foot is warm up to about mid foot and becomes cooler. Patient is at risk for peripheral artery disease Recommend a stat ABI to assess if any PAD is contributing to the patient's worsening foot edema. Patient and family member are in agreement.  - VAS [place] ABI WITH/WO TBI; Future  2. Essential hypertension, benign - stable Encouraged good control as its slows the progression of atherosclerotic disease  Current Outpatient Prescriptions on File Prior to Visit  Medication Sig Dispense Refill  . latanoprost (XALATAN) 0.005 % ophthalmic solution Place 1 drop into both eyes at bedtime.  10  . letrozole (FEMARA) 2.5 MG tablet Take 1 tablet (2.5 mg total) by mouth daily. (Patient taking differently: Take 2.5 mg by mouth daily. ON HOLD 03/20/16) 90 tablet 3  . potassium chloride (MICRO-K) 10 MEQ CR capsule TAKE 1 CAPSULE BY MOUTH  DAILY 90 capsule 2  . ramipril (ALTACE) 2.5 MG capsule TAKE 1 CAPSULE BY MOUTH  DAILY 90 capsule 0  . triamterene-hydrochlorothiazide (MAXZIDE-25) 37.5-25 MG tablet TAKE 1 TABLET BY MOUTH  DAILY 90 tablet 1   No current facility-administered medications on file prior to visit.    There are no Patient Instructions on file for this visit. No Follow-up on file.  Marely Apgar A Freddie Dymek, PA-C  STAT ABI (06/20/16): Patient underwent a stat ABI but is notable for bilateral ankle brachial indices and Doppler waveforms suggesting no significant lower extremity arterial disease. There is no previous ankle brachial index available for comparison spelled to the patient and her son and gave him the results. A copy of the results were given to her son. Encouraged the patient and her son to make a follow-up appointment with her primary care to continue workup for her lower extremity. At  this time, I do not feel that there is any peripheral artery disease contributing to her lower extremity symptoms.

## 2016-06-20 NOTE — Telephone Encounter (Signed)
Daughter requested a update on the pediatry referral.  contact Vaughan Basta - [phone]

## 2016-06-27 ENCOUNTER — Encounter: Payer: Self-pay | Admitting: Emergency Medicine

## 2016-06-27 ENCOUNTER — Emergency Department: Payer: Medicare Other

## 2016-06-27 ENCOUNTER — Emergency Department
Admission: EM | Admit: 2016-06-27 | Discharge: 2016-06-27 | Disposition: A | Discharge status: Home / Self Care | Payer: Medicare Other | Attending: Emergency Medicine | Admitting: Emergency Medicine

## 2016-06-27 DIAGNOSIS — R4182 Altered mental status, unspecified: Secondary | ICD-10-CM | POA: Diagnosis present

## 2016-06-27 DIAGNOSIS — R5383 Other fatigue: Secondary | ICD-10-CM | POA: Insufficient documentation

## 2016-06-27 DIAGNOSIS — Z853 Personal history of malignant neoplasm of breast: Secondary | ICD-10-CM | POA: Diagnosis not present

## 2016-06-27 DIAGNOSIS — I10 Essential (primary) hypertension: Secondary | ICD-10-CM | POA: Diagnosis not present

## 2016-06-27 DIAGNOSIS — Z79899 Other long term (current) drug therapy: Secondary | ICD-10-CM | POA: Diagnosis not present

## 2016-06-27 DIAGNOSIS — Z85858 Personal history of malignant neoplasm of other endocrine glands: Secondary | ICD-10-CM | POA: Diagnosis not present

## 2016-06-27 LAB — CBC WITH DIFFERENTIAL/PLATELET
Basophils Absolute: 0 10*3/uL (ref 0–0.1)
Basophils Relative: 1 %
Eosinophils Absolute: 0 10*3/uL (ref 0–0.7)
Eosinophils Relative: 0 %
HEMATOCRIT: 41.5 % (ref 35.0–47.0)
Hemoglobin: 14 g/dL (ref 12.0–16.0)
LYMPHS PCT: 15 %
Lymphs Abs: 0.5 10*3/uL — ABNORMAL LOW (ref 1.0–3.6)
MCH: 30.1 pg (ref 26.0–34.0)
MCHC: 33.8 g/dL (ref 32.0–36.0)
MCV: 89.3 fL (ref 80.0–100.0)
MONO ABS: 0.1 10*3/uL — AB (ref 0.2–0.9)
Monocytes Relative: 4 %
NEUTROS ABS: 2.7 10*3/uL (ref 1.4–6.5)
Neutrophils Relative %: 80 %
Platelets: 178 10*3/uL (ref 150–440)
RBC: 4.65 MIL/uL (ref 3.80–5.20)
RDW: 14.4 % (ref 11.5–14.5)
WBC: 3.3 10*3/uL — ABNORMAL LOW (ref 3.6–11.0)

## 2016-06-27 LAB — BASIC METABOLIC PANEL
ANION GAP: 7 (ref 5–15)
BUN: 25 mg/dL — ABNORMAL HIGH (ref 6–20)
CHLORIDE: 100 mmol/L — AB (ref 101–111)
CO2: 29 mmol/L (ref 22–32)
Calcium: 9.9 mg/dL (ref 8.9–10.3)
Creatinine, Ser: 1.27 mg/dL — ABNORMAL HIGH (ref 0.44–1.00)
GFR calc Af Amer: 40 mL/min — ABNORMAL LOW (ref >60)
GFR, EST NON AFRICAN AMERICAN: 35 mL/min — AB (ref >60)
Glucose, Bld: 124 mg/dL — ABNORMAL HIGH (ref 65–99)
POTASSIUM: 3.9 mmol/L (ref 3.5–5.1)
Sodium: 136 mmol/L (ref 135–145)

## 2016-06-27 LAB — HEPATIC FUNCTION PANEL
ALT: 15 U/L (ref 14–54)
AST: 27 U/L (ref 15–41)
Albumin: 4.5 g/dL (ref 3.5–5.0)
Alkaline Phosphatase: 53 U/L (ref 38–126)
BILIRUBIN DIRECT: 0.1 mg/dL (ref 0.1–0.5)
BILIRUBIN TOTAL: 1 mg/dL (ref 0.3–1.2)
Indirect Bilirubin: 0.9 mg/dL (ref 0.3–0.9)
Total Protein: 9.9 g/dL — ABNORMAL HIGH (ref 6.5–8.1)

## 2016-06-27 LAB — BRAIN NATRIURETIC PEPTIDE: B NATRIURETIC PEPTIDE 5: 362 pg/mL — AB (ref 0.0–100.0)

## 2016-06-27 LAB — URINALYSIS, COMPLETE (UACMP) WITH MICROSCOPIC
BACTERIA UA: NONE SEEN
BILIRUBIN URINE: NEGATIVE
Glucose, UA: NEGATIVE mg/dL
Hgb urine dipstick: NEGATIVE
KETONES UR: NEGATIVE mg/dL
LEUKOCYTES UA: NEGATIVE
Nitrite: NEGATIVE
PROTEIN: 30 mg/dL — AB
Specific Gravity, Urine: 1.02 (ref 1.005–1.030)
pH: 6.5 (ref 5.0–8.0)

## 2016-06-27 LAB — LIPASE, BLOOD: Lipase: 28 U/L (ref 11–51)

## 2016-06-27 LAB — MAGNESIUM: MAGNESIUM: 1.7 mg/dL (ref 1.7–2.4)

## 2016-06-27 LAB — TROPONIN I: Troponin I: <0.03 ng/mL (ref <0.03)

## 2016-06-27 LAB — PHOSPHORUS: PHOSPHORUS: 3.5 mg/dL (ref 2.5–4.6)

## 2016-06-27 LAB — TSH: TSH: 0.947 u[IU]/mL (ref 0.350–4.500)

## 2016-06-27 LAB — T4, FREE: Free T4: 0.82 ng/dL (ref 0.61–1.12)

## 2016-06-27 MED ORDER — SODIUM CHLORIDE 0.9 % IV BOLUS (SEPSIS)
500.0000 mL | Freq: Once | INTRAVENOUS | Status: AC
  Administered 2016-06-27: 500 mL via INTRAVENOUS

## 2016-06-27 NOTE — ED Triage Notes (Signed)
Pt sent from [facility] for reports of not feeling herself, not eating and drinking very little for several days, pt sleeping more than normal. Pt very hard of hearing. Pt in no apparent distress in triage.

## 2016-06-27 NOTE — ED Notes (Signed)
Pt resting in bed with family at bedside. NAD noted. Fluids hung per MD order. Will continue to monitor for further patient needs.

## 2016-06-27 NOTE — Discharge Instructions (Signed)
Please make sure you remain well-hydrated and follow-up with your primary care physician on Monday for a recheck. Return to the emergency department sooner for any concerns.  It was a pleasure to take care of you today, and thank you for coming to our emergency department.  If you have any questions or concerns before leaving please ask the nurse to grab me and I'm more than happy to go through your aftercare instructions again.  If you were prescribed any opioid pain medication today such as Norco, Vicodin, Percocet, morphine, hydrocodone, or oxycodone please make sure you do not drive when you are taking this medication as it can alter your ability to drive safely.  If you have any concerns once you are home that you are not improving or are in fact getting worse before you can make it to your follow-up appointment, please do not hesitate to call [phone] and come back for further evaluation.  Darel Hong MD  Results for orders placed or performed during the hospital encounter of 50/09/38  Basic metabolic panel  Result Value Ref Range   Sodium 136 135 - 145 mmol/L   Potassium 3.9 3.5 - 5.1 mmol/L   Chloride 100 (L) 101 - 111 mmol/L   CO2 29 22 - 32 mmol/L   Glucose, Bld 124 (H) 65 - 99 mg/dL   BUN 25 (H) 6 - 20 mg/dL   Creatinine, Ser 1.27 (H) 0.44 - 1.00 mg/dL   Calcium 9.9 8.9 - 10.3 mg/dL   GFR calc non Af Amer 35 (L) >60 mL/min   GFR calc Af Amer 40 (L) >60 mL/min   Anion gap 7 5 - 15  Hepatic function panel  Result Value Ref Range   Total Protein 9.9 (H) 6.5 - 8.1 g/dL   Albumin 4.5 3.5 - 5.0 g/dL   AST 27 15 - 41 U/L   ALT 15 14 - 54 U/L   Alkaline Phosphatase 53 38 - 126 U/L   Total Bilirubin 1.0 0.3 - 1.2 mg/dL   Bilirubin, Direct 0.1 0.1 - 0.5 mg/dL   Indirect Bilirubin 0.9 0.3 - 0.9 mg/dL  Troponin I  Result Value Ref Range   Troponin I <0.03 <0.03 ng/mL  Lipase, blood  Result Value Ref Range   Lipase 28 11 - 51 U/L  CBC with Differential  Result Value Ref Range   WBC 3.3 (L) 3.6 - 11.0 K/uL   RBC 4.65 3.80 - 5.20 MIL/uL   Hemoglobin 14.0 12.0 - 16.0 g/dL   HCT 41.5 35.0 - 47.0 %   MCV 89.3 80.0 - 100.0 fL   MCH 30.1 26.0 - 34.0 pg   MCHC 33.8 32.0 - 36.0 g/dL   RDW 14.4 11.5 - 14.5 %   Platelets 178 150 - 440 K/uL   Neutrophils Relative % 80 %   Neutro Abs 2.7 1.4 - 6.5 K/uL   Lymphocytes Relative 15 %   Lymphs Abs 0.5 (L) 1.0 - 3.6 K/uL   Monocytes Relative 4 %   Monocytes Absolute 0.1 (L) 0.2 - 0.9 K/uL   Eosinophils Relative 0 %   Eosinophils Absolute 0.0 0 - 0.7 K/uL   Basophils Relative 1 %   Basophils Absolute 0.0 0 - 0.1 K/uL  Urinalysis, Complete w Microscopic  Result Value Ref Range   Color, Urine YELLOW YELLOW   APPearance CLEAR CLEAR   Specific Gravity, Urine 1.020 1.005 - 1.030   pH 6.5 5.0 - 8.0   Glucose, UA NEGATIVE NEGATIVE mg/dL  Hgb urine dipstick NEGATIVE NEGATIVE   Bilirubin Urine NEGATIVE NEGATIVE   Ketones, ur NEGATIVE NEGATIVE mg/dL   Protein, ur 30 (A) NEGATIVE mg/dL   Nitrite NEGATIVE NEGATIVE   Leukocytes, UA NEGATIVE NEGATIVE   Squamous Epithelial / LPF 0-5 (A) NONE SEEN   WBC, UA 0-5 0 - 5 WBC/hpf   RBC / HPF 0-5 0 - 5 RBC/hpf   Bacteria, UA NONE SEEN NONE SEEN   Mucous PRESENT   Magnesium  Result Value Ref Range   Magnesium 1.7 1.7 - 2.4 mg/dL  Phosphorus  Result Value Ref Range   Phosphorus 3.5 2.5 - 4.6 mg/dL  TSH  Result Value Ref Range   TSH 0.947 0.350 - 4.500 uIU/mL  T4, free  Result Value Ref Range   Free T4 0.82 0.61 - 1.12 ng/dL   Dg Chest 2 View  Result Date: 06/27/2016 CLINICAL DATA:  fatigue EXAM: CHEST  2 VIEW COMPARISON:  02/12/2016 FINDINGS: More it enlargement of the cardiopericardial silhouette, stable. No confluent opacities, effusions or edema. No acute bony abnormality. IMPRESSION: Stable marked enlargement of the cardiopericardial silhouette. No active disease. Electronically Signed   By: Rolm Baptise M.D.   On: 06/27/2016 12:38   Ct Head Wo Contrast  Result Date:  06/27/2016 CLINICAL DATA:  Altered mental status EXAM: CT HEAD WITHOUT CONTRAST TECHNIQUE: Contiguous axial images were obtained from the base of the skull through the vertex without intravenous contrast. COMPARISON:  CT head 02/12/2016 FINDINGS: Brain: Mild to moderate atrophy unchanged. Low-density throughout the cerebral white matter bilaterally also unchanged. Negative for acute hemorrhage. No acute infarct Large calcification in the posterior fossa on the left is unchanged. This is located within the region of the foramen of Luschka and does not appear to be attached to the dura. This is not causing hydrocephalus. Well defined lobular contours. Vascular: Negative for hyperdense vessel Skull: Prominent vascular markings over the convexity. Numerous low-density areas are present within the calvarium unchanged from the prior study. 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 Negative for acute infarct or hemorrhage Multiple low-density skull lesions are present, stable from the prior study. Some of these appear to be vascular markings however metastatic disease or myeloma cannot be excluded. Electronically Signed   By: Franchot Gallo M.D.   On: 06/27/2016 12:52

## 2016-06-27 NOTE — ED Provider Notes (Signed)
[facility] Emergency Department Provider Note  ____________________________________________   First MD Initiated Contact with Patient 06/27/16 1213     (approximate)  I have reviewed the triage vital signs and the nursing notes.   HISTORY  Chief Complaint Possible dehydration and Fatigue  Level V exemption history Limited by the patient's altered mental status  HPI Ellen Wright is a 83 y.o. female whose son brings her to the emergency department for altered mental status for 1 day. History is challenging to obtain as the patient is very hard of hearing and is confused. Her son said that yesterday she was in her usual state of health but woke up this morning not feeling right. She asked her son to bring her to the doctor which she never does and so he became concerned. They walked into the [facility] who sent him to the emergency department. According to the son the patient has not eaten anything today. She has a past medical history of "lymph node cancer but were not treating that" as well as hypertension and possibly heart disease.   Past Medical History:  Diagnosis Date  . Breast cancer ([place]) 2006   right breast cancer  . Hearing loss   . Hyperlipidemia   . Hypertension   . Hyperthyroidism    s/p ablation    Patient Active Problem List   Diagnosis Date Noted  . Onychomycosis of left great toe 06/15/2016  . PVD (peripheral vascular disease) ([place]) 06/15/2016  . Mental status change 03/27/2016  . Fall 02/27/2016  . Primary cancer of upper outer quadrant of right female breast ([place]) 02/22/2016  . Acute respiratory distress 02/12/2016  . Acute bronchitis 02/12/2016  . Syncope 02/12/2016  . Loss of weight 10/31/2015  . Health care maintenance 03/21/2014  . Hypokalemia 09/06/2013  . Essential hypertension, benign 03/27/2012  . Hyperthyroidism 03/27/2012  . Breast mass, right 03/27/2012    Past Surgical History:  Procedure Laterality Date    . BREAST EXCISIONAL BIOPSY Right 2007   positive    Prior to Admission medications   Medication Sig Start Date End Date Taking? Authorizing Provider  latanoprost (XALATAN) 0.005 % ophthalmic solution Place 1 drop into both eyes at bedtime. 01/19/16   [provider]  letrozole (FEMARA) 2.5 MG tablet Take 1 tablet (2.5 mg total) by mouth daily. Patient taking differently: Take 2.5 mg by mouth daily. ON HOLD 03/20/16 02/24/16   Lloyd Huger, MD  potassium chloride (MICRO-K) 10 MEQ CR capsule TAKE 1 CAPSULE BY MOUTH  DAILY 01/03/16   Einar Pheasant, MD  ramipril (ALTACE) 2.5 MG capsule TAKE 1 CAPSULE BY MOUTH  DAILY 04/24/16   Einar Pheasant, MD  triamterene-hydrochlorothiazide [facility]) 37.5-25 MG tablet TAKE 1 TABLET BY MOUTH  DAILY 01/19/16   Einar Pheasant, MD    Allergies Penicillins  Family History  Problem Relation Age of Onset  . Lung disease Brother     Social History Social History  Substance Use Topics  . Smoking status: Never Smoker  . Smokeless tobacco: Current User    Types: Snuff  . Alcohol use No    Review of Systems {Level V exemption history Limited by the patient's altered mental status  ____________________________________________   PHYSICAL EXAM:  VITAL SIGNS: ED Triage Vitals [06/27/16 1208]  Enc Vitals Group     BP (!) 153/77     Pulse Rate 80     Resp 18     Temp 98.5 F (36.9 C)  Temp Source Oral     SpO2 96 %     Weight 100 lb (45.4 kg)     Height 5' (1.524 m)     Head Circumference      Peak Flow      Pain Score      Pain Loc      Pain Edu?      Excl. in [place]?     Constitutional: Pleasant cooperative in no distress very hard of hearing Eyes: PERRL EOMI. Head: Atraumatic. Nose: No congestion/rhinnorhea. Mouth/Throat: No trismus Neck: No stridor.   Cardiovascular: Normal rate, regular rhythm. Grossly normal heart sounds.  Good peripheral circulation. Respiratory: Normal respiratory effort.  No retractions.  Lungs CTAB and moving good air Gastrointestinal: Soft nontender Musculoskeletal: No lower extremity edema   Neurologic:  Normal speech and language. No gross focal neurologic deficits are appreciated. Skin:  Skin is warm, dry and intact. No rash noted. Psychiatric: Significant dementia.  ____________________________________________   LABS (all labs ordered are listed, but only abnormal results are displayed)  Labs Reviewed  BASIC METABOLIC PANEL - Abnormal; Notable for the following:       Result Value   Chloride 100 (*)    Glucose, Bld 124 (*)    BUN 25 (*)    Creatinine, Ser 1.27 (*)    GFR calc non Af Amer 35 (*)    GFR calc Af Amer 40 (*)    All other components within normal limits  HEPATIC FUNCTION PANEL - Abnormal; Notable for the following:    Total Protein 9.9 (*)    All other components within normal limits  BRAIN NATRIURETIC PEPTIDE - Abnormal; Notable for the following:    B Natriuretic Peptide 362.0 (*)    All other components within normal limits  CBC WITH DIFFERENTIAL/PLATELET - Abnormal; Notable for the following:    WBC 3.3 (*)    Lymphs Abs 0.5 (*)    Monocytes Absolute 0.1 (*)    All other components within normal limits  URINALYSIS, COMPLETE (UACMP) WITH MICROSCOPIC - Abnormal; Notable for the following:    Protein, ur 30 (*)    Squamous Epithelial / LPF 0-5 (*)    All other components within normal limits  TROPONIN I  LIPASE, BLOOD  MAGNESIUM  PHOSPHORUS  TSH  T4, FREE    No signs of acute ischemia and no signs of urinary tract infection __________________________________________  EKG  ED ECG REPORT I, Darel Hong, the attending physician, personally viewed and interpreted this ECG.  Date: 06/27/2016 EKG Time:  Rate: 74 Rhythm: normal sinus rhythm QRS Axis: normal Intervals: normal ST/T Wave abnormalities: normal Narrative Interpretation: unremarkable  ____________________________________________  RADIOLOGY  CT scan with  chronic changes but no acute disease Chest x-ray with chronic changes but no acute disease ____________________________________________   PROCEDURES  Procedure(s) performed: no  Procedures  Critical Care performed: no  Observation: no ____________________________________________   INITIAL IMPRESSION / ASSESSMENT AND PLAN / ED COURSE  Pertinent labs & imaging results that were available during my care of the patient were reviewed by me and considered in my medical decision making (see chart for details).  The patient arrives well-appearing although confused according her family. Differential for her confused 83 year old who is diapered dependent and is quite broad but she will require CT scan as well as blood work and an in and out urinalysis.     ----------------------------------------- 2:38 PM on 06/27/2016 -----------------------------------------  According to family the patient is now back at her  baseline. Labs are unremarkable and she has no signs of urinary tract infection. They're comfortable having her home. ____________________________________________   FINAL CLINICAL IMPRESSION(S) / ED DIAGNOSES  Final diagnoses:  Fatigue, unspecified type      NEW MEDICATIONS STARTED DURING THIS VISIT:  Discharge Medication List as of 06/27/2016  2:37 PM       Note:  This document was prepared using Dragon voice recognition software and may include unintentional dictation errors.     Darel Hong, MD 06/28/16 1606

## 2016-07-06 ENCOUNTER — Ambulatory Visit: Payer: Medicare Other | Admitting: Podiatry

## 2016-07-09 ENCOUNTER — Other Ambulatory Visit: Payer: Self-pay | Admitting: Internal Medicine

## 2016-07-10 ENCOUNTER — Other Ambulatory Visit: Payer: Self-pay

## 2016-07-10 MED ORDER — RAMIPRIL 2.5 MG PO CAPS: 2.5000 mg | ORAL_CAPSULE | Freq: Every day | ORAL | 1 refills | Status: AC

## 2016-07-27 ENCOUNTER — Encounter: Payer: Self-pay | Admitting: Podiatry

## 2016-07-27 ENCOUNTER — Ambulatory Visit (INDEPENDENT_AMBULATORY_CARE_PROVIDER_SITE_OTHER): Payer: Medicare Other | Admitting: Podiatry

## 2016-07-27 DIAGNOSIS — B351 Tinea unguium: Secondary | ICD-10-CM

## 2016-07-27 DIAGNOSIS — M79676 Pain in unspecified toe(s): Secondary | ICD-10-CM | POA: Diagnosis not present

## 2016-07-27 MED ORDER — DOXYCYCLINE HYCLATE 100 MG PO TABS: 100.0000 mg | ORAL_TABLET | Freq: Two times a day (BID) | ORAL | 0 refills | Status: DC

## 2016-07-27 NOTE — Patient Instructions (Signed)

## 2016-07-30 ENCOUNTER — Telehealth: Payer: Self-pay | Admitting: Podiatry

## 2016-07-30 MED ORDER — DOXYCYCLINE HYCLATE 100 MG PO TABS: 100.0000 mg | ORAL_TABLET | Freq: Two times a day (BID) | ORAL | 0 refills | Status: DC

## 2016-07-30 NOTE — Telephone Encounter (Signed)
Spoke with daughter Vaughan Basta, she stated that patient was able to take 1 dose of antibiotic that was sent in from Dr. Amalia Hailey, now she can't find the bottle anywhere.  She wanted to know if another rx could be sent in.  Per Dr. Amalia Hailey, ok to resend antibiotic to pharmacy      Script has been sent.

## 2016-07-30 NOTE — Telephone Encounter (Signed)
Yes I called and left a message earlier today around lunchtime and I still not have gotten a call back. If someone would please call me back.

## 2016-07-30 NOTE — Telephone Encounter (Signed)
Yes, this message is in reference to Ellen Wright who was seen there on Friday. If you would, please give me a call back at [phone].

## 2016-08-04 NOTE — Progress Notes (Signed)
   HPI: A she presents today for evaluation of a nail problems the left great toe. Patient states that dark and thick and is extremely painful. The toe was swollen approximately one month ago and she is been soaking it and Epsom salt. Patient denies trauma. Patient presents today for further treatment and evaluation   Physical Exam: General: The patient is alert and oriented x3 in no acute distress.  Dermatology: Hyperkeratotic dystrophic nail noted to the left great toenail which is painful with palpation. There is some very localized cellulitis around the toe and nail borders. Skin is warm, dry and supple bilateral lower extremities. Negative for open lesions or macerations.  Vascular: Palpable pedal pulses bilaterally. No edema or erythema noted. Capillary refill within normal limits.  Neurological: Epicritic and protective threshold grossly intact bilaterally.   Musculoskeletal Exam: Range of motion within normal limits to all pedal and ankle joints bilateral. Muscle strength 5/5 in all groups bilateral.    Assessment: 1. Symptomatic painful dystrophic nail left great toenail   Plan of Care:  1. Patient was evaluated. 2. Today I discussed conservative versus procedural management of the toenail. The patient opts for total permanent nail avulsion. Permanent total nail avulsion was performed after local anesthesia infiltration consisting of 2% lidocaine plain with 0.5% Marcaine plain. The toe was prepped and aseptic manner and the total permanent nail avulsion was performed with 937 seconds applications of phenol. Followed by alcohol flush. 3. Prescription for doxycycline 100 mg 4. Return to clinic in 2 weeks   Edrick Kins, DPM [facility]  Dr. Edrick Kins, DPM    [address], [place] [address]                Office [phone]  Fax [phone]

## 2016-08-14 ENCOUNTER — Encounter: Payer: Self-pay | Admitting: Podiatry

## 2016-08-14 ENCOUNTER — Ambulatory Visit (INDEPENDENT_AMBULATORY_CARE_PROVIDER_SITE_OTHER): Payer: Medicare Other | Admitting: Podiatry

## 2016-08-14 DIAGNOSIS — S91209D Unspecified open wound of unspecified toe(s) with damage to nail, subsequent encounter: Secondary | ICD-10-CM

## 2016-08-18 NOTE — Progress Notes (Signed)
   Subjective: Patient presents today 2 weeks post total nail permanent nail avulsion procedure. Patient states that the toe and nail fold is feeling much better.  Objective: Skin is warm, dry and supple. The nail bed and respective toe appears healthy and viable without any sign of infectious process. Open wound to the associated nail fold with a granular wound base and moderate amount of fibrotic tissue. Minimal drainage noted. Mild erythema around the periungual region likely due to phenol chemical matricectomy.  Assessment: #1 postop permanent total nail avulsion left great toe #2 open wound periungual nail fold of respective digit.   Plan of care: #1 patient was evaluated  #2 debridement of open wound was performed to the periungual border of the respective toe using a currette. Antibiotic ointment and Band-Aid was applied. #3 patient is to return to clinic on a PRN  basis.   Edrick Kins, DPM [facility]  Dr. Edrick Kins, [place]                                        [place], [place] [address]                Office [phone]  Fax [phone]

## 2016-09-26 ENCOUNTER — Telehealth: Payer: Self-pay | Admitting: Internal Medicine

## 2016-09-26 DIAGNOSIS — S4992XA Unspecified injury of left shoulder and upper arm, initial encounter: Secondary | ICD-10-CM | POA: Diagnosis not present

## 2016-09-26 DIAGNOSIS — S40012A Contusion of left shoulder, initial encounter: Secondary | ICD-10-CM | POA: Diagnosis not present

## 2016-09-26 DIAGNOSIS — W19XXXA Unspecified fall, initial encounter: Secondary | ICD-10-CM | POA: Diagnosis not present

## 2016-09-26 NOTE — Telephone Encounter (Signed)
Pt fell out of chair yesterday. Daughter says pt is sore, shoulder and back hurt. She has an appt on Monday. Please call her daughter, Vaughan Basta. [phone].

## 2016-09-26 NOTE — Telephone Encounter (Signed)
Advised patients daughter to take patient to ED or urgent care for Xrays.

## 2016-10-01 ENCOUNTER — Ambulatory Visit (INDEPENDENT_AMBULATORY_CARE_PROVIDER_SITE_OTHER): Payer: Medicare Other | Admitting: Internal Medicine

## 2016-10-01 ENCOUNTER — Encounter: Payer: Self-pay | Admitting: Internal Medicine

## 2016-10-01 DIAGNOSIS — I739 Peripheral vascular disease, unspecified: Secondary | ICD-10-CM

## 2016-10-01 DIAGNOSIS — R4182 Altered mental status, unspecified: Secondary | ICD-10-CM | POA: Diagnosis not present

## 2016-10-01 DIAGNOSIS — W19XXXA Unspecified fall, initial encounter: Secondary | ICD-10-CM

## 2016-10-01 DIAGNOSIS — R634 Abnormal weight loss: Secondary | ICD-10-CM | POA: Diagnosis not present

## 2016-10-01 DIAGNOSIS — C50411 Malignant neoplasm of upper-outer quadrant of right female breast: Secondary | ICD-10-CM | POA: Diagnosis not present

## 2016-10-01 DIAGNOSIS — I10 Essential (primary) hypertension: Secondary | ICD-10-CM | POA: Diagnosis not present

## 2016-10-01 NOTE — Progress Notes (Signed)
Patient ID: Ellen Wright, female   DOB: 03-30-1918, 83 y.o.   MRN: [id number]   Subjective:    Patient ID: Ellen Wright, female    DOB: [birth year], 83 y.o.   MRN: [id number]  HPI  Patient here for a scheduled follow up.  She is accompanied by her daughter.  History obtained from both of them.  Since her last visit, she saw Joycelyn Schmid and was referred to [facility].  ABIs ok.  She was also seen in the ER 06/27/16  for altered mental status.  Note reviewed.  Labs unrevealing.  Discharged home.  Daughter reports she has not been eating as well.  Decreased appetite.  They are trying to encourage nutrional supplements.  Continues to lose weight.  No nausea or vomiting.  She did fall out of a chair last week.  Not sure if hit her head.  No complaints of headache.  No dizziness.  Does report some persistent left shoulder pain since the fall.  Some increased pain with abduction at or above 90 degrees.  No other pain.  Was evaluated at urgent care.  Had xray.  States ok.  Has been sleeping more.  Has the diagnosis of breast cancer.  Currently not on treatment.  Declines surgery or XRT.  Was on letrozole, but did not tolerate.  Daughter concerned regarding pts continued decline. Still with intermittent confusion.  Discussed hospice referral.  Daughter in agreement.     Past Medical History:  Diagnosis Date  . Breast cancer ([place]) 2006   right breast cancer  . Hearing loss   . Hyperlipidemia   . Hypertension   . Hyperthyroidism    s/p ablation   Past Surgical History:  Procedure Laterality Date  . BREAST EXCISIONAL BIOPSY Right 2007   positive   Family History  Problem Relation Age of Onset  . Lung disease Brother    Social History   Social History  . Marital status: Widowed    Spouse name: N/A  . Number of children: N/A  . Years of education: N/A   Social History Main Topics  . Smoking status: Never Smoker  . Smokeless tobacco: Current User    Types: Snuff  . Alcohol use No   . Drug use: No  . Sexual activity: Not Asked   Other Topics Concern  . None   Social History Narrative  . None    Outpatient Encounter Prescriptions as of 10/01/2016  Medication Sig  . latanoprost (XALATAN) 0.005 % ophthalmic solution Place 1 drop into both eyes at bedtime.  . potassium chloride (MICRO-K) 10 MEQ CR capsule TAKE 1 CAPSULE BY MOUTH  DAILY  . ramipril (ALTACE) 2.5 MG capsule Take 1 capsule (2.5 mg total) by mouth daily.  Marland Kitchen triamterene-hydrochlorothiazide (MAXZIDE-25) 37.5-25 MG tablet TAKE 1 TABLET BY MOUTH  DAILY  . [DISCONTINUED] doxycycline (VIBRA-TABS) 100 MG tablet Take 1 tablet (100 mg total) by mouth 2 (two) times daily.  . [DISCONTINUED] letrozole (FEMARA) 2.5 MG tablet Take 1 tablet (2.5 mg total) by mouth daily. (Patient taking differently: Take 2.5 mg by mouth daily. ON HOLD 03/20/16)   No facility-administered encounter medications on file as of 10/01/2016.     Review of Systems  Constitutional:       Decreased appetite.  Weight loss.    HENT: Negative for congestion and sinus pressure.   Respiratory: Negative for cough and shortness of breath.   Cardiovascular: Negative for chest pain and leg swelling.  Gastrointestinal: Negative for abdominal pain,  diarrhea, nausea and vomiting.  Musculoskeletal: Negative for joint swelling.       Left shoulder pain as outlined.    Skin: Negative for color change and rash.  Neurological: Negative for dizziness, light-headedness and headaches.       Sleeping more.   Psychiatric/Behavioral: Negative for agitation and dysphoric mood.       Objective:    Physical Exam  Constitutional: No distress.  HENT:  Nose: Nose normal.  Mouth/Throat: Oropharynx is clear and moist.  Hard of hearing.   Neck: Neck supple. No thyromegaly present.  Cardiovascular: Normal rate and regular rhythm.   Pulmonary/Chest: Breath sounds normal. No respiratory distress. She has no wheezes.  Abdominal: Soft. Bowel sounds are normal. There  is no tenderness.  Musculoskeletal: She exhibits no edema or tenderness.  Increased pain with abduction of her left arm especially at or above 90 degrees.    Lymphadenopathy:    She has no cervical adenopathy.  Neurological:  Answers questions that I wrote down for her.    Skin: No rash noted. No erythema.  Psychiatric: She has a normal mood and affect. Her behavior is normal.    BP 140/83 (BP Location: Left Arm, Patient Position: Sitting, Cuff Size: Normal)   Pulse 93   Temp 98.6 F (37 C) (Oral)   Resp 14   Ht 5' (1.524 m)   Wt 91 lb 12.8 oz (41.6 kg)   SpO2 95%   BMI 17.93 kg/m  Wt Readings from Last 3 Encounters:  10/01/16 91 lb 12.8 oz (41.6 kg)  06/27/16 100 lb (45.4 kg)  06/20/16 100 lb (45.4 kg)     Lab Results  Component Value Date   WBC 3.3 (L) 06/27/2016   HGB 14.0 06/27/2016   HCT 41.5 06/27/2016   PLT 178 06/27/2016   GLUCOSE 124 (H) 06/27/2016   CHOL 148 03/27/2012   TRIG 59.0 03/27/2012   HDL 58.30 03/27/2012   LDLCALC 78 03/27/2012   ALT 15 06/27/2016   AST 27 06/27/2016   NA 136 06/27/2016   K 3.9 06/27/2016   CL 100 (L) 06/27/2016   CREATININE 1.27 (H) 06/27/2016   BUN 25 (H) 06/27/2016   CO2 29 06/27/2016   TSH 0.947 06/27/2016    Dg Chest 2 View  Result Date: 06/27/2016 CLINICAL DATA:  fatigue EXAM: CHEST  2 VIEW COMPARISON:  02/12/2016 FINDINGS: More it enlargement of the cardiopericardial silhouette, stable. No confluent opacities, effusions or edema. No acute bony abnormality. IMPRESSION: Stable marked enlargement of the cardiopericardial silhouette. No active disease. Electronically Signed   By: Rolm Baptise M.D.   On: 06/27/2016 12:38   Ct Head Wo Contrast  Result Date: 06/27/2016 CLINICAL DATA:  Altered mental status EXAM: CT HEAD WITHOUT CONTRAST TECHNIQUE: Contiguous axial images were obtained from the base of the skull through the vertex without intravenous contrast. COMPARISON:  CT head 02/12/2016 FINDINGS: Brain: Mild to moderate  atrophy unchanged. Low-density throughout the cerebral white matter bilaterally also unchanged. Negative for acute hemorrhage. No acute infarct Large calcification in the posterior fossa on the left is unchanged. This is located within the region of the foramen of Luschka and does not appear to be attached to the dura. This is not causing hydrocephalus. Well defined lobular contours. Vascular: Negative for hyperdense vessel Skull: Prominent vascular markings over the convexity. Numerous low-density areas are present within the calvarium unchanged from the prior study. 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 Negative for acute infarct or hemorrhage Multiple low-density skull lesions are present, stable from the prior study. Some of these appear to be vascular markings however metastatic disease or myeloma cannot be excluded. Electronically Signed   By: Franchot Gallo M.D.   On: 06/27/2016 12:52       Assessment & Plan:   Problem List Items Addressed This Visit    Essential hypertension, benign    Blood pressure under reasonable control.  Same medication regimen.  Follow.        Fall    [place] out of a chair last week.  Not sure if hit head.  Pt able to read and answer some questions today.  No complaints of headache or dizziness.  Hold on scanning.  Does have residual left shoulder pain.  Previous xray - no fracture.  With increased pain with abduction.  Tylenol as needed.  Refer to ortho for further evaluation and treatment.  Avoid increased antiinflammatories.        Loss of weight    Not eating as well.  Continue to encourage nutritional supplements.  Discussed hospice referral.  Daughter in agreement.        Relevant Orders   Ambulatory referral to Hospice   Mental status change    Still seems to wax and wane.  Able to read and answer questions  today.  Discussed recent fall and f/u CT.  Wants to hold on further scanning.  Appears to be improved.  Follow.        Primary cancer of upper outer quadrant of right female breast ([place])    Off letrozole.  Did not tolerate.  Declines surgery or XRT.  Discussed with her daughter today.  Request referral back to Dr Grayland Ormond to discussed if any other non invasive treatment options.        Relevant Orders   Ambulatory referral to Hospice   Ambulatory referral to Oncology   PVD (peripheral vascular disease) ([place])    Saw vascular surgery. ABIs ok.  Note reviewed.           Einar Pheasant, MD

## 2016-10-02 ENCOUNTER — Encounter: Payer: Self-pay | Admitting: Internal Medicine

## 2016-10-04 ENCOUNTER — Encounter: Payer: Self-pay | Admitting: Internal Medicine

## 2016-10-04 NOTE — Assessment & Plan Note (Signed)
Saw vascular surgery. ABIs ok.  Note reviewed.

## 2016-10-04 NOTE — Assessment & Plan Note (Signed)
Not eating as well.  Continue to encourage nutritional supplements.  Discussed hospice referral.  Daughter in agreement.

## 2016-10-04 NOTE — Assessment & Plan Note (Signed)
Still seems to wax and wane.  Able to read and answer questions today.  Discussed recent fall and f/u CT.  Wants to hold on further scanning.  Appears to be improved.  Follow.

## 2016-10-04 NOTE — Assessment & Plan Note (Signed)
Blood pressure under reasonable control.  Same medication regimen.  Follow.

## 2016-10-04 NOTE — Assessment & Plan Note (Signed)
[place] out of a chair last week.  Not sure if hit head.  Pt able to read and answer some questions today.  No complaints of headache or dizziness.  Hold on scanning.  Does have residual left shoulder pain.  Previous xray - no fracture.  With increased pain with abduction.  Tylenol as needed.  Refer to ortho for further evaluation and treatment.  Avoid increased antiinflammatories.

## 2016-10-04 NOTE — Assessment & Plan Note (Signed)
Off letrozole.  Did not tolerate.  Declines surgery or XRT.  Discussed with her daughter today.  Request referral back to Dr Grayland Ormond to discussed if any other non invasive treatment options.

## 2016-10-05 ENCOUNTER — Telehealth: Payer: Self-pay | Admitting: *Deleted

## 2016-10-05 NOTE — Telephone Encounter (Signed)
I find it unusual that the PCP makes the hospice referral but refuses to be the physician of record. I have not seen this patient since February 2018. Although hospice seems appropriate given her multiple comorbidities, I would have to complete a face-to-face visit prior to signing any orders.

## 2016-10-05 NOTE — Telephone Encounter (Signed)
Debra from [facility] called to report that the daughter is needing advice regarding her mother's status. The PCP has made the hospice referral but has not agreed to be the attending. She would like to schedule an appointment to talk with Dr. Grayland Ormond. The Hospice nurse would like direction from Dr. Grayland Ormond. [phone]

## 2016-10-05 NOTE — Telephone Encounter (Signed)
I spoke with Hilda Blades at [facility], she will have patients daughter contact office to set up appointment with Dr. Grayland Ormond.

## 2016-10-08 ENCOUNTER — Telehealth: Payer: Self-pay | Admitting: *Deleted

## 2016-10-08 NOTE — Telephone Encounter (Signed)
Called to report that family all in agreement for hospice services and that the Hospice Medical Director will be her attending. They will not be bringing her to the office as requested

## 2016-10-08 NOTE — Telephone Encounter (Signed)
Ok, thank you

## 2016-10-10 DIAGNOSIS — H401133 Primary open-angle glaucoma, bilateral, severe stage: Secondary | ICD-10-CM | POA: Diagnosis not present

## 2016-12-24 ENCOUNTER — Telehealth: Payer: Self-pay

## 2016-12-24 NOTE — Telephone Encounter (Signed)
Cancelled appt

## 2016-12-24 NOTE — Telephone Encounter (Signed)
If she is being followed by hospice now, then she does not need to keep appt.  Let us know if needs anything.  Block appt.

## 2016-12-24 NOTE — Telephone Encounter (Signed)
LMTCB

## 2016-12-24 NOTE — Telephone Encounter (Signed)
Copied from [place] #[id number]. Topic: Inquiry >> Dec 24, 2016 12:01 PM Pricilla Handler wrote: Reason for CRM: Patient's daughter Vaughan Basta called wanting to know if her mother needs to keep the 12/25/2016 appt scheduled. Patient has been seen by the [facility] Medical Director. Patient's daughter would like a call back at [phone].       Thank You!!!

## 2016-12-25 ENCOUNTER — Ambulatory Visit: Payer: Medicare Other | Admitting: Internal Medicine

## 2017-02-07 ENCOUNTER — Telehealth: Payer: Self-pay | Admitting: Internal Medicine

## 2017-02-07 NOTE — Telephone Encounter (Signed)
OK to give verbal for senna Stool softner?

## 2017-02-07 NOTE — Telephone Encounter (Signed)
Verbals given to Dominica for Senna S. 1 tablet q day. If pt develops diarrhea advised to call us back.

## 2017-02-07 NOTE — Telephone Encounter (Signed)
Need to clarify message.  Not states having diarrhea and needs stool softener.  Need to clarify what they are asking for.

## 2017-02-07 NOTE — Telephone Encounter (Signed)
Clarified message. Requesting verbals for senna s stool softner due to constipation. NOT diarrhea. Lorriane Shire stated that patient is not able to tell her when she has a bowel movement and son stated that sometimes patient will come out of the bathroom with stool under finger nails. Lorriane Shire checked patient for impaction and got some hard stool out but not a lot.

## 2017-02-07 NOTE — Telephone Encounter (Signed)
Ok to give order for seena s

## 2017-02-07 NOTE — Telephone Encounter (Signed)
CRM for notification. See Telephone encounter for:   02/07/17.  Caller name: Lorriane Shire  Relation to pt: RN from [employer] [facility] [employer] back number: [phone]    Reason for call:  RN requesting verbal orders for "senna S" stool softener  due to patient experiencing diarrhea, please advise

## 2017-03-06 ENCOUNTER — Telehealth: Payer: Self-pay

## 2017-03-06 NOTE — Telephone Encounter (Signed)
Primary nurse from hospice states that pt is ok from fall. Abrasion to left forehead. Vitals stable. No complaints. Lorriane Shire did report that area on the right breast is starting to get worse. She is going to send update on measurements of wound, etc.

## 2017-03-07 NOTE — Telephone Encounter (Signed)
Noted.  Let me know if needs anything.   

## 2017-03-12 ENCOUNTER — Other Ambulatory Visit: Payer: Self-pay | Admitting: *Deleted

## 2017-03-12 MED ORDER — SENNOSIDES-DOCUSATE SODIUM 8.6-50 MG PO TABS: 1.0000 | ORAL_TABLET | Freq: Every day | ORAL | 0 refills | Status: DC

## 2017-04-15 ENCOUNTER — Other Ambulatory Visit: Payer: Self-pay | Admitting: Internal Medicine

## 2017-06-04 ENCOUNTER — Other Ambulatory Visit: Payer: Self-pay | Admitting: Internal Medicine

## 2017-08-05 ENCOUNTER — Other Ambulatory Visit: Payer: Self-pay | Admitting: Internal Medicine

## 2017-08-05 IMAGING — CT CT HEAD W/O CM
3 series · 15 of 45 positions shown, 18 images · non-contrast
Comparison: CT head 02/12/2016

CLINICAL DATA: Altered mental status

EXAM:
CT HEAD WITHOUT CONTRAST
TECHNIQUE: Contiguous axial images were obtained from the base of the skull
through the vertex without intravenous contrast.

[Series 2: head wo · axial · 0.41mm/px · z∈[+216,+331]mm · 9 of 28 slices shown, 12 images]
[im 3/28  brain]
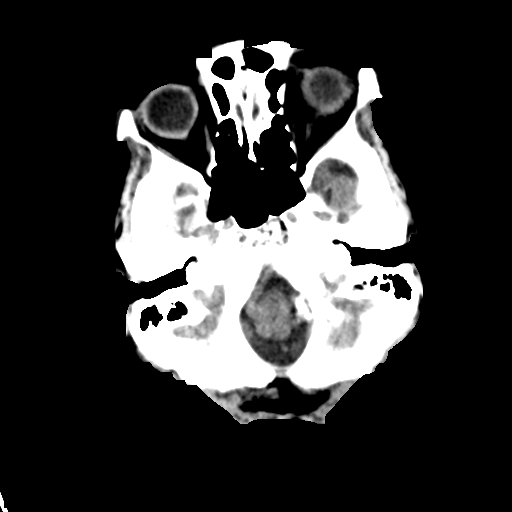
[im 3/28  bone]
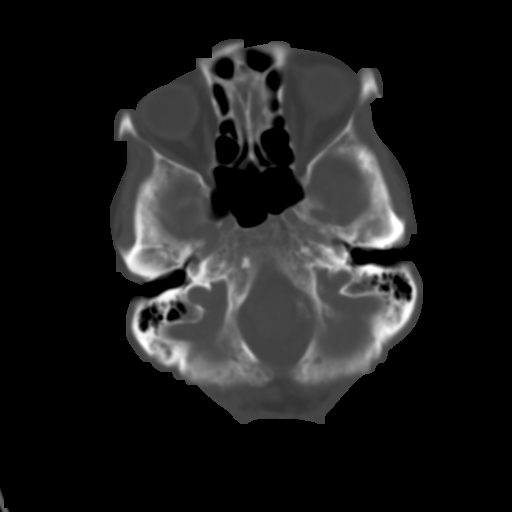
[im 6/28  brain]
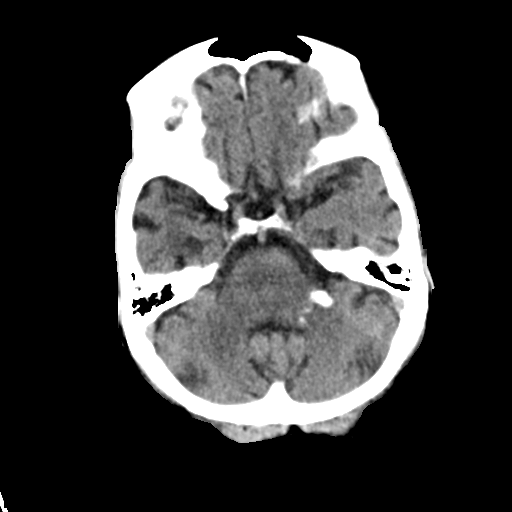
[im 9/28  brain]
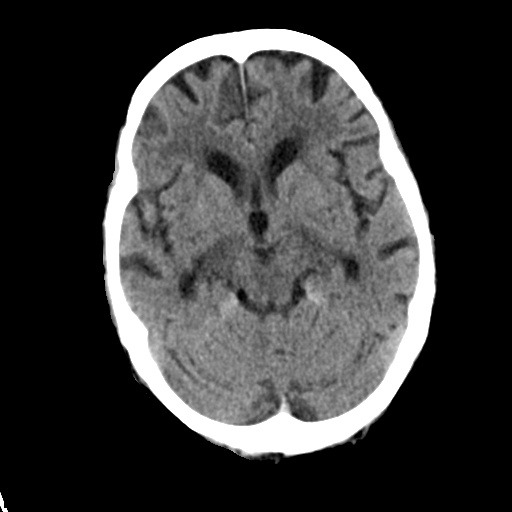
[im 12/28  brain]
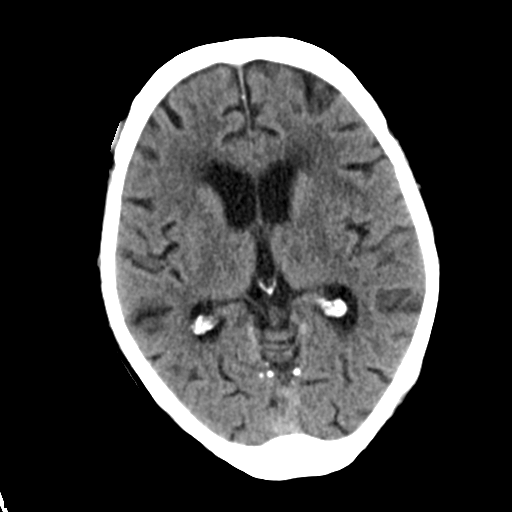
[im 15/28  brain]
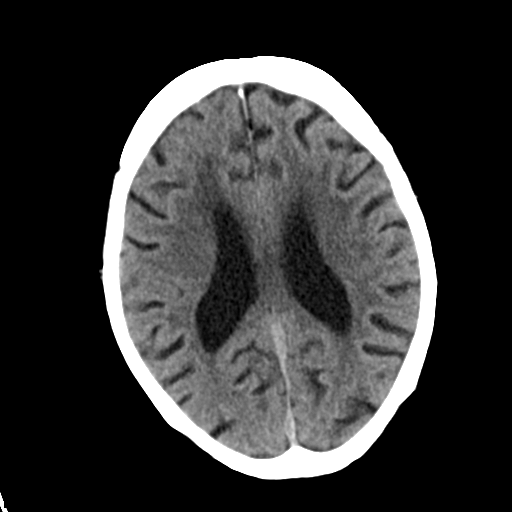
[im 15/28  bone]
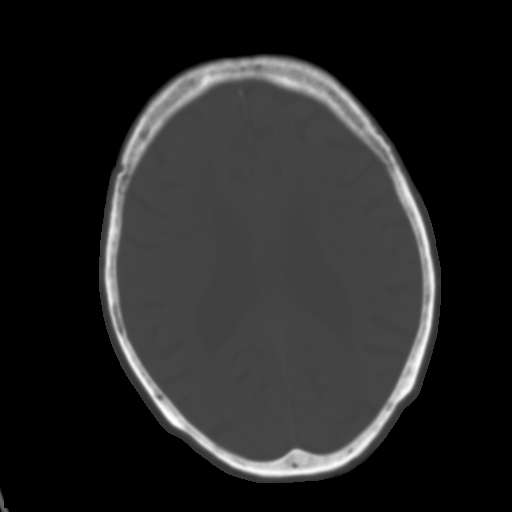
[im 17/28  brain]
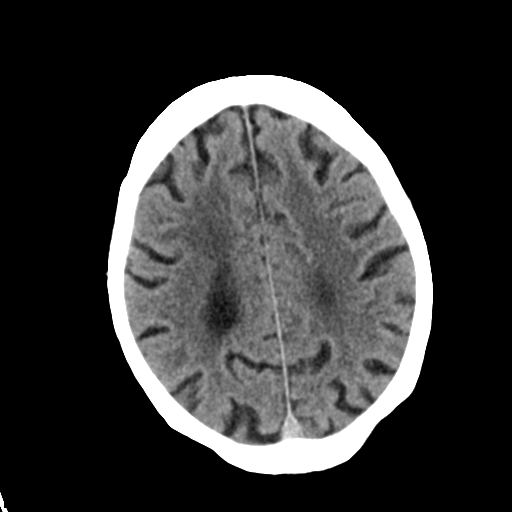
[im 20/28  brain]
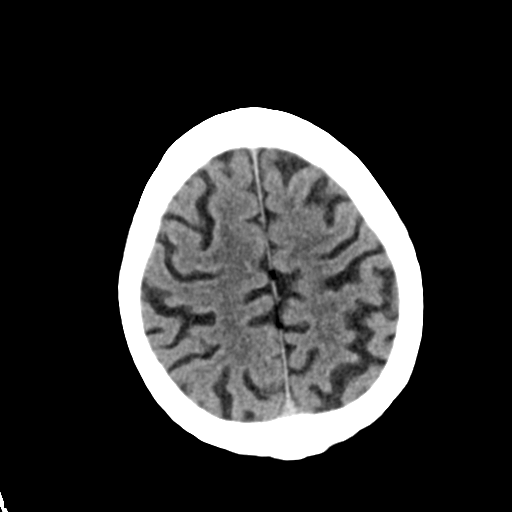
[im 23/28  brain]
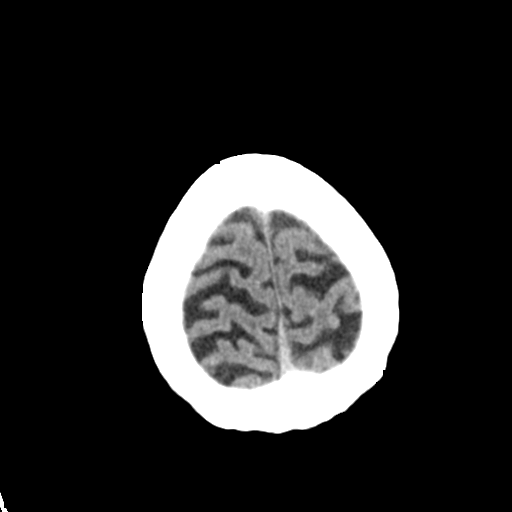
[im 26/28  brain]
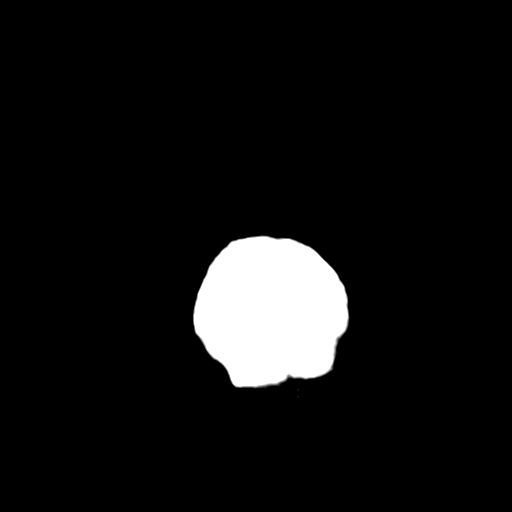
[im 26/28  bone]
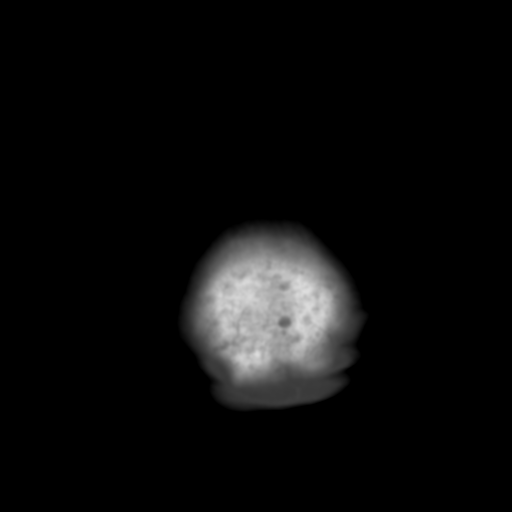

[Series 4: coronal soft tissue · coronal · 0.29mm/px · 3 of 67 slices shown]
[im 23/67  brain]
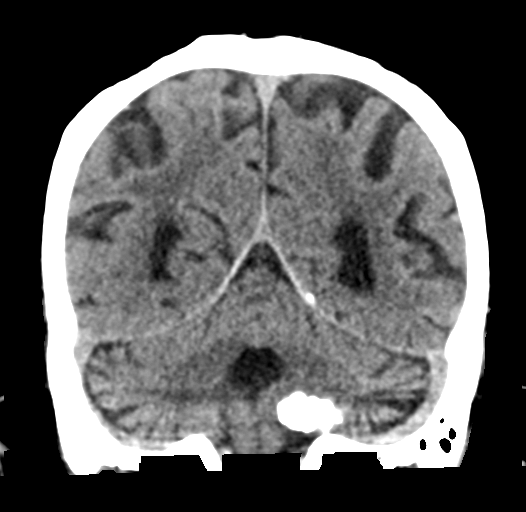
[im 30/67  brain]
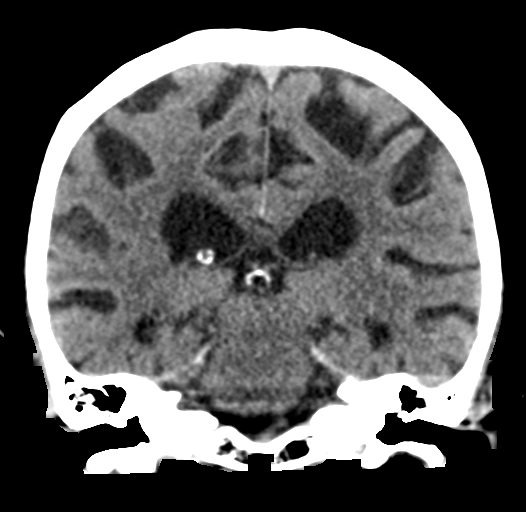
[im 37/67  brain]
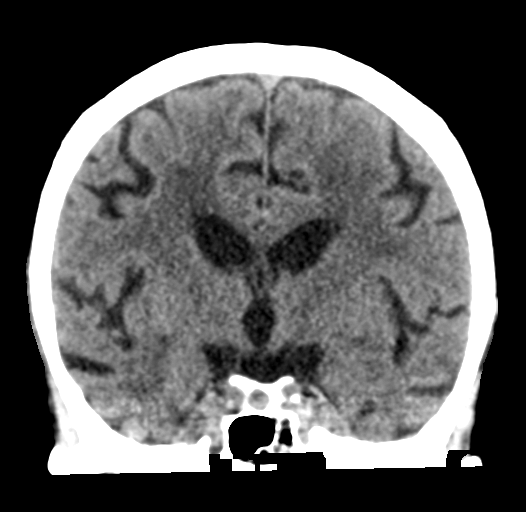

[Series 5: sagittal soft tissue · sagittal · 0.30mm/px · 3 of 54 slices shown]
[im 18/54  brain]
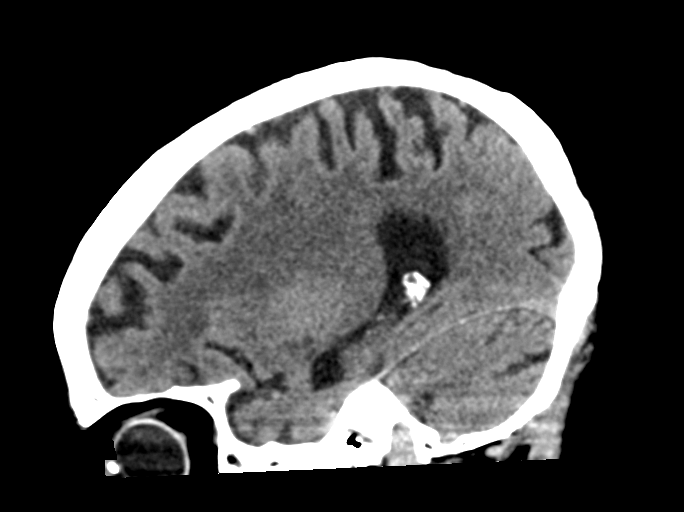
[im 27/54  brain]
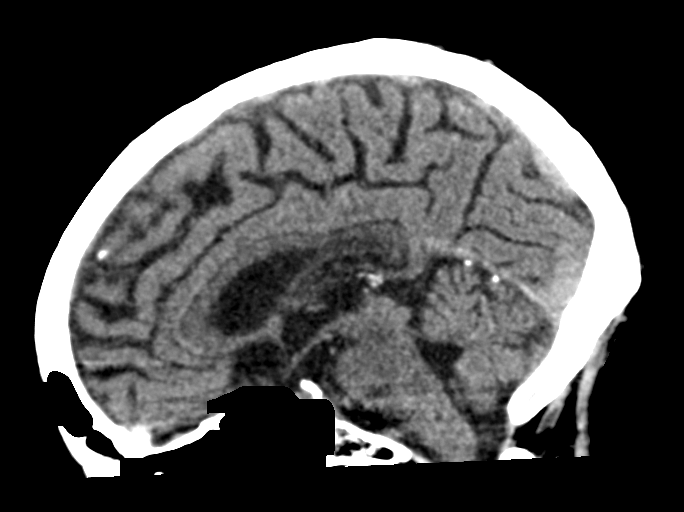
[im 36/54  brain]
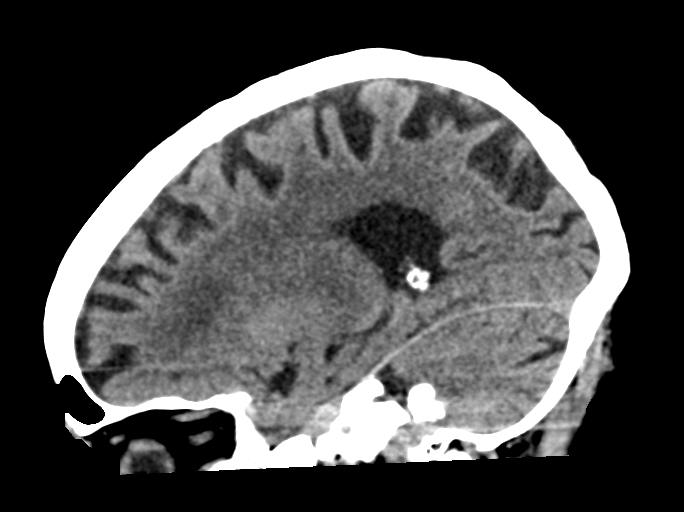

[15 of 45 positions shown; findings below may reference images not displayed]

FINDINGS: Brain: Mild to moderate atrophy unchanged. Low-density throughout
the cerebral white matter bilaterally also unchanged. Negative for
acute hemorrhage. No acute infarct

Large calcification in the posterior fossa on the left is unchanged.
This is located within the region of the foramen of Luschka and does
not appear to be attached to the dura. This is not causing
hydrocephalus. Well defined lobular contours.

Vascular: Negative for hyperdense vessel

Skull: Prominent vascular markings over the convexity. Numerous
low-density areas are present within the calvarium unchanged from
the prior study. Negative for fracture

Sinuses/Orbits: Negative

Other: None
IMPRESSION: Large calcific mass in the left foramina of Luschka unchanged.
Possible meningioma versus calcified subependymoma.

Negative for acute infarct or hemorrhage

Multiple low-density skull lesions are present, stable from the
prior study. Some of these appear to be vascular markings however
metastatic disease or myeloma cannot be excluded.

## 2017-08-08 ENCOUNTER — Telehealth: Payer: Self-pay | Admitting: Internal Medicine

## 2017-08-08 NOTE — Telephone Encounter (Signed)
Copied from [place] [phone]. Topic: General - Other >> Aug 08, 2017 11:00 AM Yvette Rack wrote: Reason for CRM: [facility] Nurse from [facility] [phone] calling stating that pt daughter stop giving her mother the potassium pill due to her not being able to swallow it and that the nurse would like to discontinue the pt BP medicines due to low readings In the 90s on 07-08-17 92/40  07-16-17 90/40  07-22-17 100/50  and on 7-18 it was 90/50  also the Nurse states that the daughter will put pt in the hospice home for a few days for respite

## 2017-08-08 NOTE — Telephone Encounter (Signed)
Left message for Ellen Wright to call back.

## 2017-08-08 NOTE — Telephone Encounter (Signed)
Please advise 

## 2017-08-09 ENCOUNTER — Telehealth: Payer: Self-pay

## 2017-08-09 NOTE — Telephone Encounter (Signed)
Can a verbal be given for this before the weekend?

## 2017-08-09 NOTE — Telephone Encounter (Signed)
See other phone note

## 2017-08-09 NOTE — Telephone Encounter (Signed)
Given low blood pressure readings, ok to hold blood pressure medication.

## 2017-08-09 NOTE — Telephone Encounter (Signed)
Copied from [place] [phone]. Topic: General - Other >> Aug 09, 2017  2:12 PM Yvette Rack wrote: Reason for CRM: [facility] Nurse from [place] returned call to office. Cb# [phone]

## 2017-08-09 NOTE — Telephone Encounter (Signed)
Left detailed voicemail for Ellen Wright at [facility]. Verbal given to hold BP medication due to low BP readings.

## 2017-10-23 ENCOUNTER — Other Ambulatory Visit: Payer: Self-pay | Admitting: Internal Medicine

## 2017-10-29 ENCOUNTER — Telehealth: Payer: Self-pay

## 2017-10-29 NOTE — Telephone Encounter (Signed)
Copied from [place] [phone]. Topic: General - Other >> Oct 29, 2017  1:43 PM Gardiner Ramus wrote: Reason for CRM: [place] with [facility] [place] stated that she faxed over a request for plan of treatment. She would like to know the status. Please advise [phone]

## 2017-10-30 NOTE — Telephone Encounter (Signed)
Faxed, Pamala Hurry is aware and has received

## 2017-11-29 ENCOUNTER — Telehealth: Payer: Self-pay

## 2017-11-29 NOTE — Telephone Encounter (Signed)
Recieved Death Certificate from ___[place] _______ Delivered/Placed ________nurse box ____ ° ° ° °

## 2017-11-29 NOTE — Telephone Encounter (Signed)
Death certificate re-routed it was delivered to the wrong office. Funeral home contacted for pickup.

## 2017-12-08 DEATH — deceased
